# Patient Record
Sex: Female | Born: 1995 | Hispanic: Yes | Smoking: Former smoker
Health system: Southern US, Community
[De-identification: ages and names within clinical notes are randomized; demographics above are authoritative.]

## PROBLEM LIST (undated history)

## (undated) DIAGNOSIS — Z862 Personal history of diseases of the blood and blood-forming organs and certain disorders involving the immune mechanism: Secondary | ICD-10-CM

## (undated) DIAGNOSIS — N644 Mastodynia: Secondary | ICD-10-CM

## (undated) HISTORY — DX: Mastodynia: N64.4

## (undated) HISTORY — PX: OTHER SURGICAL HISTORY: SHX169

## (undated) HISTORY — DX: Personal history of diseases of the blood and blood-forming organs and certain disorders involving the immune mechanism: Z86.2

---
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
## 2018-06-13 DIAGNOSIS — F419 Anxiety disorder, unspecified: Secondary | ICD-10-CM

## 2018-06-13 HISTORY — DX: Anxiety disorder, unspecified: F41.9

## 2018-08-30 ENCOUNTER — Emergency Department: Payer: Self-pay

## 2018-08-30 ENCOUNTER — Emergency Department
Admission: EM | Admit: 2018-08-30 | Discharge: 2018-08-31 | Disposition: A | Discharge status: Home / Self Care | Payer: Self-pay | Attending: Student in an Organized Health Care Education/Training Program | Admitting: Student in an Organized Health Care Education/Training Program

## 2018-08-30 ENCOUNTER — Encounter: Payer: Self-pay | Admitting: Emergency Medicine

## 2018-08-30 ENCOUNTER — Other Ambulatory Visit: Payer: Self-pay

## 2018-08-30 DIAGNOSIS — R1032 Left lower quadrant pain: Secondary | ICD-10-CM | POA: Insufficient documentation

## 2018-08-30 DIAGNOSIS — R112 Nausea with vomiting, unspecified: Secondary | ICD-10-CM

## 2018-08-30 DIAGNOSIS — B9689 Other specified bacterial agents as the cause of diseases classified elsewhere: Secondary | ICD-10-CM | POA: Insufficient documentation

## 2018-08-30 DIAGNOSIS — N760 Acute vaginitis: Secondary | ICD-10-CM | POA: Insufficient documentation

## 2018-08-30 LAB — URINALYSIS, COMPLETE (UACMP) WITH MICROSCOPIC
BACTERIA UA: NONE SEEN
Bilirubin Urine: NEGATIVE
GLUCOSE, UA: NEGATIVE mg/dL
Ketones, ur: NEGATIVE mg/dL
NITRITE: NEGATIVE
Protein, ur: 30 mg/dL — AB
Specific Gravity, Urine: 1.032 — ABNORMAL HIGH (ref 1.005–1.030)
pH: 5 (ref 5.0–8.0)

## 2018-08-30 LAB — WET PREP, GENITAL
SPERM: NONE SEEN
Trich, Wet Prep: NONE SEEN

## 2018-08-30 LAB — COMPREHENSIVE METABOLIC PANEL
ALBUMIN: 4.7 g/dL (ref 3.5–5.0)
ALK PHOS: 37 U/L — AB (ref 38–126)
ALT: 21 U/L (ref 0–44)
ANION GAP: 9 (ref 5–15)
AST: 27 U/L (ref 15–41)
BUN: 14 mg/dL (ref 6–20)
CALCIUM: 9.3 mg/dL (ref 8.9–10.3)
CHLORIDE: 106 mmol/L (ref 98–111)
CO2: 25 mmol/L (ref 22–32)
Creatinine, Ser: 0.74 mg/dL (ref 0.44–1.00)
GFR calc Af Amer: >60 mL/min (ref >60)
GFR calc non Af Amer: >60 mL/min (ref >60)
GLUCOSE: 111 mg/dL — AB (ref 70–99)
Potassium: 3.6 mmol/L (ref 3.5–5.1)
SODIUM: 140 mmol/L (ref 135–145)
Total Bilirubin: 0.5 mg/dL (ref 0.3–1.2)
Total Protein: 7.8 g/dL (ref 6.5–8.1)

## 2018-08-30 LAB — CHLAMYDIA/NGC RT PCR (ARMC ONLY)
Chlamydia Tr: NOT DETECTED
N gonorrhoeae: NOT DETECTED

## 2018-08-30 LAB — INFLUENZA PANEL BY PCR (TYPE A & B)
Influenza A By PCR: NEGATIVE
Influenza B By PCR: NEGATIVE

## 2018-08-30 LAB — CBC
HEMATOCRIT: 38.8 % (ref 36.0–46.0)
HEMOGLOBIN: 12.8 g/dL (ref 12.0–15.0)
MCH: 29.6 pg (ref 26.0–34.0)
MCHC: 33 g/dL (ref 30.0–36.0)
MCV: 89.8 fL (ref 80.0–100.0)
Platelets: 304 10*3/uL (ref 150–400)
RBC: 4.32 MIL/uL (ref 3.87–5.11)
RDW: 13.2 % (ref 11.5–15.5)
WBC: 14.9 10*3/uL — ABNORMAL HIGH (ref 4.0–10.5)
nRBC: 0 % (ref 0.0–0.2)

## 2018-08-30 LAB — POCT PREGNANCY, URINE: Preg Test, Ur: NEGATIVE

## 2018-08-30 LAB — LIPASE, BLOOD: Lipase: 37 U/L (ref 11–51)

## 2018-08-30 MED ORDER — METRONIDAZOLE 500 MG PO TABS: 500.0000 mg | ORAL_TABLET | Freq: Two times a day (BID) | ORAL | 0 refills | Status: DC

## 2018-08-30 MED ORDER — SODIUM CHLORIDE 0.9% FLUSH: 3.0000 mL | Freq: Once | INTRAVENOUS | Status: DC

## 2018-08-30 MED ORDER — FLUCONAZOLE 150 MG PO TABS: 150.0000 mg | ORAL_TABLET | Freq: Every day | ORAL | 0 refills | Status: DC

## 2018-08-30 MED ORDER — METRONIDAZOLE 500 MG PO TABS: 500.0000 mg | ORAL_TABLET | Freq: Two times a day (BID) | ORAL | 0 refills | Status: AC

## 2018-08-30 MED ORDER — ONDANSETRON 4 MG PO TBDP: 4.0000 mg | ORAL_TABLET | Freq: Three times a day (TID) | ORAL | 0 refills | Status: DC | PRN

## 2018-08-30 MED ORDER — PROMETHAZINE HCL 25 MG/ML IJ SOLN
12.5000 mg | Freq: Four times a day (QID) | INTRAMUSCULAR | Status: DC | PRN
  Administered 2018-08-30: 12.5 mg via INTRAVENOUS
  Filled 2018-08-30: qty 1

## 2018-08-30 MED ORDER — SODIUM CHLORIDE 0.9 % IV BOLUS
1000.0000 mL | Freq: Once | INTRAVENOUS | Status: AC
  Administered 2018-08-30: 1000 mL via INTRAVENOUS

## 2018-08-30 NOTE — Discharge Instructions (Addendum)

## 2018-08-30 NOTE — ED Provider Notes (Signed)
[facility] Emergency Department Provider Note    First MD Initiated Contact with Patient 08/30/18 2103     (approximate)  I have reviewed the triage vital signs and the nursing notes.   HISTORY  Chief Complaint Fever    HPI Kellie Lowery is a 23 y.o. female presents for evaluation of nausea vomiting and crampy abdominal pain.  States that she can feel her insides moving.  Denies any diarrhea.  Has had multiple episodes of nonbloody emesis.  Says that she is having fevers at home as well.  No cough or congestion.  No known sick contacts.   She is sexually active but denies any vaginal discharge but was initially interested in being screened for STI.  Denies any right lower quadrant abdominal pain.  States is been primarily on the left side.   History reviewed. No pertinent past medical history. No family history on file. History reviewed. No pertinent surgical history. There are no active problems to display for this patient.     Prior to Admission medications   Medication Sig Start Date End Date Taking? Authorizing Provider  fluconazole (DIFLUCAN) 150 MG tablet Take 1 tablet (150 mg total) by mouth daily. 08/30/18   Willy Eddy, MD  metroNIDAZOLE (FLAGYL) 500 MG tablet Take 1 tablet (500 mg total) by mouth 2 (two) times daily for 7 days. 08/30/18 09/06/18  Willy Eddy, MD  ondansetron (ZOFRAN ODT) 4 MG disintegrating tablet Take 1 tablet (4 mg total) by mouth every 8 (eight) hours as needed for nausea or vomiting. 08/30/18   Willy Eddy, MD    Allergies Patient has no known allergies.    Social History Social History   Tobacco Use  . Smoking status: Never Smoker  . Smokeless tobacco: Never Used  Substance Use Topics  . Alcohol use: Yes  . Drug use: Not on file    Review of Systems Patient denies headaches, rhinorrhea, blurry vision, numbness, shortness of breath, chest pain, edema, cough, abdominal pain, nausea, vomiting,  diarrhea, dysuria, fevers, rashes or hallucinations unless otherwise stated above in HPI. ____________________________________________   PHYSICAL EXAM:  VITAL SIGNS: Vitals:   08/30/18 2200 08/30/18 2230  BP: 131/82 (!) 90/52  Pulse: 78 73  Resp:    Temp:    SpO2: 100% 100%    Constitutional: Alert and oriented.  Eyes: Conjunctivae are normal.  Head: Atraumatic. Nose: No congestion/rhinnorhea. Mouth/Throat: Mucous membranes are moist.   Neck: No stridor. Painless ROM.  Cardiovascular: Normal rate, regular rhythm. Grossly normal heart sounds.  Good peripheral circulation. Respiratory: Normal respiratory effort.  No retractions. Lungs CTAB. Gastrointestinal: Soft and with minimal llq ttp.  No rlq or ruq ttp. No distention. No abdominal bruits. No CVA tenderness. Genitourinary: deferred Musculoskeletal: No lower extremity tenderness nor edema.  No joint effusions. Neurologic:  Normal speech and language. No gross focal neurologic deficits are appreciated. No facial droop Skin:  Skin is warm, dry and intact. No rash noted. Psychiatric: Mood and affect are normal. Speech and behavior are normal.  ____________________________________________   LABS (all labs ordered are listed, but only abnormal results are displayed)  Results for orders placed or performed during the hospital encounter of 08/30/18 (from the past 24 hour(s))  Lipase, blood     Status: None   Collection Time: 08/30/18  6:20 PM  Result Value Ref Range   Lipase 37 11 - 51 U/L  Comprehensive metabolic panel     Status: Abnormal   Collection Time: 08/30/18  6:20  PM  Result Value Ref Range   Sodium 140 135 - 145 mmol/L   Potassium 3.6 3.5 - 5.1 mmol/L   Chloride 106 98 - 111 mmol/L   CO2 25 22 - 32 mmol/L   Glucose, Bld 111 (H) 70 - 99 mg/dL   BUN 14 6 - 20 mg/dL   Creatinine, Ser 1.74 0.44 - 1.00 mg/dL   Calcium 9.3 8.9 - 94.4 mg/dL   Total Protein 7.8 6.5 - 8.1 g/dL   Albumin 4.7 3.5 - 5.0 g/dL   AST 27 15  - 41 U/L   ALT 21 0 - 44 U/L   Alkaline Phosphatase 37 (L) 38 - 126 U/L   Total Bilirubin 0.5 0.3 - 1.2 mg/dL   GFR calc non Af Amer >60 >60 mL/min   GFR calc Af Amer >60 >60 mL/min   Anion gap 9 5 - 15  CBC     Status: Abnormal   Collection Time: 08/30/18  6:20 PM  Result Value Ref Range   WBC 14.9 (H) 4.0 - 10.5 K/uL   RBC 4.32 3.87 - 5.11 MIL/uL   Hemoglobin 12.8 12.0 - 15.0 g/dL   HCT 96.7 59.1 - 63.8 %   MCV 89.8 80.0 - 100.0 fL   MCH 29.6 26.0 - 34.0 pg   MCHC 33.0 30.0 - 36.0 g/dL   RDW 46.6 59.9 - 35.7 %   Platelets 304 150 - 400 K/uL   nRBC 0.0 0.0 - 0.2 %  Pregnancy, urine POC     Status: None   Collection Time: 08/30/18  6:55 PM  Result Value Ref Range   Preg Test, Ur NEGATIVE NEGATIVE  Influenza panel by PCR (type A & B)     Status: None   Collection Time: 08/30/18  9:26 PM  Result Value Ref Range   Influenza A By PCR NEGATIVE NEGATIVE   Influenza B By PCR NEGATIVE NEGATIVE  Wet prep, genital     Status: Abnormal   Collection Time: 08/30/18 10:07 PM  Result Value Ref Range   Yeast Wet Prep HPF POC PRESENT (A) NONE SEEN   Trich, Wet Prep NONE SEEN NONE SEEN   Clue Cells Wet Prep HPF POC PRESENT (A) NONE SEEN   WBC, Wet Prep HPF POC MODERATE (A) NONE SEEN   Sperm NONE SEEN    ____________________________________________ ____________________________________________  RADIOLOGY  I personally reviewed all radiographic images ordered to evaluate for the above acute complaints and reviewed radiology reports and findings.  These findings were personally discussed with the patient.  Please see medical record for radiology report.  ____________________________________________   PROCEDURES  Procedure(s) performed:  Procedures    Critical Care performed: no ____________________________________________   INITIAL IMPRESSION / ASSESSMENT AND PLAN / ED COURSE  Pertinent labs & imaging results that were available during my care of the patient were reviewed by me  and considered in my medical decision making (see chart for details).   DDX: Enteritis, colitis, viral illness, UTI, pregnancy, torsion, TOA, PID, doubt appendicitis  Kellie Lowery is a 23 y.o. who presents to the ED with symptoms as described above.  Patient nontoxic-appearing but does have mild leukocytosis.  We will check urinalysis.  X-ray shows no evidence of obstruction.  Exam is not consistent with PID.  Patient declining pelvic exam which I think is reasonable given lack of significant discharge but will order ultrasound to evaluate for torsion, TOA or other causes of left lower quadrant abdominal pain.  Most likely scenario is  viral enteritis.  This does not seem clinically consistent with acute appendicitis.  Give IV fluids as well as IV antiemetics.  Will reassess.  If ultrasound negative anticipate discharge home.  Clinical Course as of Aug 30 2247  Thu Aug 30, 2018  2245 Urinalysis does not show any evidence of UTI.  Wet prep is positive for yeast as well as clue cells.  Will treat for that.  Pending ultrasound to evaluate for TOA or torsion.  Anticipate that that is negative patient will be stable and appropriate for discharge home.   [PR]    Clinical Course User Index [PR] Willy Eddy, MD     As part of my medical decision making, I reviewed the following data within the electronic MEDICAL RECORD NUMBER Nursing notes reviewed and incorporated, Labs reviewed, notes from prior ED visits and [place] Controlled Substance Database   ____________________________________________   FINAL CLINICAL IMPRESSION(S) / ED DIAGNOSES  Final diagnoses:  Nausea & vomiting  Left lower quadrant abdominal pain  LLQ abdominal pain  Bacterial vaginosis      NEW MEDICATIONS STARTED DURING THIS VISIT:  New Prescriptions   FLUCONAZOLE (DIFLUCAN) 150 MG TABLET    Take 1 tablet (150 mg total) by mouth daily.   METRONIDAZOLE (FLAGYL) 500 MG TABLET    Take 1 tablet (500 mg total) by mouth 2 (two)  times daily for 7 days.   ONDANSETRON (ZOFRAN ODT) 4 MG DISINTEGRATING TABLET    Take 1 tablet (4 mg total) by mouth every 8 (eight) hours as needed for nausea or vomiting.     Note:  This document was prepared using Dragon voice recognition software and may include unintentional dictation errors.    Willy Eddy, MD 08/30/18 2249

## 2018-08-30 NOTE — ED Triage Notes (Signed)
Pt c/o fever x2 days and vomiting x4 today. PT also states body aches.

## 2018-08-30 NOTE — ED Notes (Signed)
Pt states that for the past three days she has felt weak, generalized body aches, nausea, vomiting and fever. She states that her recorded fever at home was 100.2. Family at the bedside.

## 2019-06-28 ENCOUNTER — Telehealth: Payer: Self-pay | Admitting: Family Medicine

## 2019-06-28 NOTE — Telephone Encounter (Signed)
Call to client and removal scheduled for 07/12/2019. Client to arrive at 0800 and states this is an okay time. Jossie Ng, RN

## 2019-06-28 NOTE — Telephone Encounter (Signed)
wants nexplanon removed

## 2019-07-12 ENCOUNTER — Ambulatory Visit (LOCAL_COMMUNITY_HEALTH_CENTER): Payer: Self-pay | Admitting: Advanced Practice Midwife

## 2019-07-12 ENCOUNTER — Encounter: Payer: Self-pay | Admitting: Advanced Practice Midwife

## 2019-07-12 ENCOUNTER — Other Ambulatory Visit: Payer: Self-pay

## 2019-07-12 VITALS — BP 95/56 | Ht 62.75 in | Wt 141.2 lb

## 2019-07-12 DIAGNOSIS — Z3009 Encounter for other general counseling and advice on contraception: Secondary | ICD-10-CM

## 2019-07-12 DIAGNOSIS — Z3040 Encounter for surveillance of contraceptives, unspecified: Secondary | ICD-10-CM

## 2019-07-12 DIAGNOSIS — Z8659 Personal history of other mental and behavioral disorders: Secondary | ICD-10-CM

## 2019-07-12 DIAGNOSIS — Z9152 Personal history of nonsuicidal self-harm: Secondary | ICD-10-CM | POA: Insufficient documentation

## 2019-07-12 NOTE — Progress Notes (Signed)
In desiring Nexplanon removal due to irregular bleeding x "months" and losing hair Sharlette Dense, RN

## 2019-07-12 NOTE — Progress Notes (Signed)
   [facility] problem [facility]  Subjective:  Berna Bue is a 24 y.o.SHF nullip nonsmoker being seen today for Nexplanon removal (inserted 2018) No chief complaint on file.   HPI Pt wants Nexplanon removal due to losing hair and irregular bleeding x "months".  LMP??  Last sex 07/07/19 without condom.  Not working, not in school.  Living with cousins.  Doesn't want any birth control and doesn't want pregnancy; counseled pt will become pregnant if having unprotected sex--still declines  Does the patient have a current or past history of drug use? No   No components found for: HCV]   Health Maintenance Due  Topic Date Due  . HIV Screening  01/26/2013  . INFLUENZA VACCINE  01/12/2019  . PAP-Cervical Cytology Screening  01/27/2019  . PAP SMEAR-Modifier  01/27/2019    ROS  The following portions of the patient's history were reviewed and updated as appropriate: allergies, current medications, past family history, past medical history, past social history, past surgical history and problem list. Problem list updated.   See flowsheet for other program required questions.  Objective:   Vitals:   07/12/19 0832  BP: (!) 95/56  Weight: 141 lb 3.2 oz (64 kg)  Height: 5' 2.75" (1.594 m)    Physical Exam    Assessment and Plan:  Berna Bue is a 24 y.o. female presenting to the [facility] Department for a Women's Health problem visit  1. Hx of self mutilation--cutter age 59  2. Family planning Nexplanon Removal Patient identified, informed consent performed, consent signed.   Appropriate time out taken. Nexplanon site identified.  Area prepped in usual sterile fashon. 3 ml of 1% lidocaine with Epinephrine was used to anesthetize the area at the distal end of the implant and along implant site. A small stab incision was made right beside the implant on the distal portion.  The Nexplanon rod was grasped using straight  hemostats/manual and removed without difficulty.  There was minimal blood loss. There were no complications.  Steri-strips were applied over the small incision.  A pressure bandage was applied to reduce any bruising.  The patient tolerated the procedure well and was given post procedure instructions.  Nexplanon:   Counseled patient to take OTC analgesic starting as soon as lidocaine starts to wear off and take regularly for at least 48 hr to decrease discomfort.  Specifically to take with food or milk to decrease stomach upset and for IB 600 mg (3 tablets) every 6 hrs; IB 800 mg (4 tablets) every 8 hrs; or Aleve 2 tablets every 12 hrs.     3. Encounter for surveillance of contraceptives, unspecified contraceptive Pt refuses birth control     No follow-ups on file.  No future appointments.  Alberteen Spindle, CNM

## 2020-06-26 ENCOUNTER — Ambulatory Visit (LOCAL_COMMUNITY_HEALTH_CENTER): Payer: 59 | Admitting: Physician Assistant

## 2020-06-26 ENCOUNTER — Other Ambulatory Visit: Payer: Self-pay

## 2020-06-26 VITALS — BP 114/74 | Ht 63.0 in | Wt 149.0 lb

## 2020-06-26 DIAGNOSIS — Z7189 Other specified counseling: Secondary | ICD-10-CM

## 2020-06-26 NOTE — Progress Notes (Signed)
Patient states she is present today with a headache she has had for three days now and had a fever two days ago I requested she needs to go get a covid test and reschedule after she received a negative test patient was givin information on [facility] covid testing and patient left to go get tested. Patient stated symptoms after vitals and some paaperwork was signed.

## 2020-06-26 NOTE — Progress Notes (Signed)
Patient into clinic scheduled for IP/RP in [facility].  Patient reports to CNA, while she is getting the patient's vitals, that she has had a headache for 2 days and a fever 2 days ago.  Counseled CNA to give patient information on [facility] for COVID testing and that she can reschedule her appointment for another time, after she has been tested and without symptoms.

## 2020-08-12 ENCOUNTER — Other Ambulatory Visit: Payer: Self-pay

## 2020-08-12 ENCOUNTER — Encounter: Payer: Self-pay | Admitting: Advanced Practice Midwife

## 2020-08-12 ENCOUNTER — Ambulatory Visit: Payer: Self-pay

## 2020-08-12 ENCOUNTER — Ambulatory Visit (LOCAL_COMMUNITY_HEALTH_CENTER): Payer: 59 | Admitting: Advanced Practice Midwife

## 2020-08-12 VITALS — BP 106/59 | HR 71 | Temp 98.3°F | Ht 63.0 in | Wt 154.8 lb

## 2020-08-12 DIAGNOSIS — Z3009 Encounter for other general counseling and advice on contraception: Secondary | ICD-10-CM | POA: Diagnosis not present

## 2020-08-12 DIAGNOSIS — F419 Anxiety disorder, unspecified: Secondary | ICD-10-CM | POA: Insufficient documentation

## 2020-08-12 DIAGNOSIS — E663 Overweight: Secondary | ICD-10-CM

## 2020-08-12 LAB — WET PREP FOR TRICH, YEAST, CLUE
Trichomonas Exam: NEGATIVE
Yeast Exam: NEGATIVE

## 2020-08-12 LAB — HEMOGLOBIN, FINGERSTICK: Hemoglobin: 12 g/dL (ref 11.1–15.9)

## 2020-08-12 NOTE — Progress Notes (Signed)
Wet mount and hgb reviewed - no interventions needed per standing order. Jossie Ng, RN

## 2020-08-12 NOTE — Progress Notes (Signed)
[facility] [address] Main Number: [phone]    Family Planning Visit- Initial Visit  Subjective:  Kellie Lowery is a 25 y.o. SHF G1P0010 exsmoker  being seen today for an initial well woman visit and to discuss family planning options.  She is currently using None for pregnancy prevention. Patient reports she does want a pregnancy in the next year.  Patient has the following medical conditions has Hx of self mutilation--cutter age 26 and Overweight BMI=27.4 on their problem list.  Chief Complaint  Patient presents with  . Annual Exam    Desires pregnancy    Patient reports wants to conceive.  Needs pap and physical.  Nexplanon removed 07/12/19.  LMP 07/16/20.  Last sex 08/08/20 without condom; with current partner x 2 years; 1 partner in last 3 mo.  Last cig 2021. Last vaped 2021.  Last MJ 08/08/20.  Last ETOH 06/06/20 (6 Smirnoffs+8 shots Petrone+2 shots liquor) 4x/year.  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 C/o "hard to get out of bed the past 2 mo, gained 20 lbs in 2 mo". Agrees to counseling with [name], LCSW.  No pap on record.  Patient denies SI/HI   Body mass index is 27.42 kg/m. - Patient is eligible for diabetes screening based on BMI and age >42?  not applicable HA1C ordered? not applicable  Patient reports 1  partner/s in last year. Desires STI screening?  Yes  Has patient been screened once for HCV in the past?  No  No results found for: HCVAB  Does the patient have current drug use (including MJ), have a partner with drug use, and/or has been incarcerated since last result? Yes  If yes-- Screen for HCV through [facility]   Does the patient meet criteria for HBV testing? No  Criteria:  -Household, sexual or needle sharing contact with HBV -History of drug use -HIV positive -Those with known Hep C   Health Maintenance Due  Topic Date Due  . Hepatitis C Screening  Never done  .  COVID-19 Vaccine (1) Never done  . HPV VACCINES (1 - 2-dose series) Never done  . HIV Screening  Never done  . PAP-Cervical Cytology Screening  Never done  . PAP SMEAR-Modifier  Never done  . INFLUENZA VACCINE  Never done    Review of Systems  Constitutional: Positive for weight loss (20 lbs wt gain in past 2 mo.; plays soccer).    The following portions of the patient's history were reviewed and updated as appropriate: allergies, current medications, past family history, past medical history, past social history, past surgical history and problem list. Problem list updated.   See flowsheet for other program required questions.  Objective:   Vitals:   08/12/20 1509  BP: (!) 106/59  Pulse: 71  Temp: 98.3 F (36.8 C)  Weight: 154 lb 12.8 oz (70.2 kg)  Height: 5\' 3"  (1.6 m)    Physical Exam Constitutional:      Appearance: Normal appearance. She is normal weight.  HENT:     Head: Normocephalic and atraumatic.     Mouth/Throat:     Mouth: Mucous membranes are moist.  Eyes:     Conjunctiva/sclera: Conjunctivae normal.  Neck:     Comments: Thyroid without masses or enlargement Negative cervical lymphaddenopathy Cardiovascular:     Rate and Rhythm: Normal rate and regular rhythm.  Pulmonary:     Effort: Pulmonary effort is normal.  Breath sounds: Normal breath sounds.  Chest:  Breasts:     Right: Normal.     Left: Normal.      Comments: C/o left breast outer quadrant tenderness x 1 year. No masses palpated. To stop caffeine x 2 mo, take Vitamin E daily and return if not better in 2 mo Abdominal:     Palpations: Abdomen is soft.     Comments: Soft without masses or tenderness  Genitourinary:    General: Normal vulva.     Exam position: Lithotomy position.     Vagina: Vaginal discharge (grey leukorrhea, ph>4.5 with sl malodor; c/o pain after sex x hours x1) present.     Cervix: Normal.     Uterus: Normal.      Adnexa: Right adnexa normal and left adnexa normal.      Rectum: Normal.     Comments: Pap done; neg CMT Musculoskeletal:        General: Normal range of motion.     Cervical back: Normal range of motion and neck supple.  Skin:    General: Skin is warm and dry.  Neurological:     Mental Status: She is alert.  Psychiatric:        Mood and Affect: Mood normal.       Assessment and Plan:  Kellie Lowery is a 25 y.o. female presenting to the [facility] Department for an initial well woman exam/family planning visit  Contraception counseling: Reviewed all forms of birth control options in the tiered based approach. available including abstinence; over the counter/barrier methods; hormonal contraceptive medication including pill, patch, ring, injection,contraceptive implant, ECP; hormonal and nonhormonal IUDs; permanent sterilization options including vasectomy and the various tubal sterilization modalities. Risks, benefits, and typical effectiveness rates were reviewed.  Questions were answered.  Written information was also given to the patient to review.  Patient desires to conceive, this was prescribed for patient. She will follow up in prn for surveillance.  She was told to call with any further questions, or with any concerns about this method of contraception.  Emphasized use of condoms 100% of the time for STI prevention.  Patient was not offered ECP. Marland Kitchen ECP was not accepted by the patient. ECP counseling was not given - see RN documentation  1. Overweight BMI=27.4   2. Family planning Pt desires Tb skin test today Treat wet mount per standing orders Immunization nurse consult - WET PREP FOR TRICH, YEAST, CLUE - Hemoglobin, venipuncture - Chlamydia/Gonorrhea [place] [facility] - Syphilis Serology, [facility] - HIV [place] [facility] - Pap IG (Image Guided) - Ambulatory referral to Behavioral Health     Return in about 1 year (around 08/12/2021) for yearly physical exam.  No future appointments.  Alberteen Spindle, CNM

## 2020-08-17 LAB — PAP IG (IMAGE GUIDED): PAP Smear Comment: 0

## 2020-10-12 ENCOUNTER — Encounter: Payer: Self-pay | Admitting: Nurse Practitioner

## 2020-10-12 NOTE — Progress Notes (Signed)
PAP normal, HPV not done.  Repeat PAP in 3 years (08/2023) per Arnetha Courser, CNM. Routed 10/09/2020.  PAP card mailed today.  MyCHART account not active. [name], RN

## 2021-04-19 ENCOUNTER — Ambulatory Visit: Payer: 59

## 2021-05-05 ENCOUNTER — Other Ambulatory Visit: Payer: Self-pay

## 2021-05-05 ENCOUNTER — Ambulatory Visit: Payer: Self-pay | Admitting: Physician Assistant

## 2021-05-05 DIAGNOSIS — Z113 Encounter for screening for infections with a predominantly sexual mode of transmission: Secondary | ICD-10-CM

## 2021-05-05 NOTE — Progress Notes (Signed)
Patient into clinic for IS visit.  Patient reports symptoms but also reports that she started an OTC antifungal cream last pm to help relieve her symptoms.  Counseled patient that due to using cream, the testing if it was done today would not be accurate.  Recommended that patient complete full course of treatment that she has started and RTC if her symptoms have not resolved.  Counseled that for ost accurate results, she should not use anything in her vagina for 48-72 hr prior to pelvic exam.  Patient agrees with above plan.

## 2021-06-13 NOTE — L&D Delivery Note (Addendum)
         Delivery Note   Kellie Lowery is a 26 y.o. [name] at [redacted]w[redacted]d Estimated Date of Delivery: 05/01/22  PRE-OPERATIVE DIAGNOSIS:  1) [redacted]w[redacted]d pregnancy.    POST-OPERATIVE DIAGNOSIS:  1) [redacted]w[redacted]d pregnancy s/p Vaginal, Spontaneous    Delivery Type: Vaginal, Spontaneous    Delivery Anesthesia: Epidural   Labor Complications:  none    ESTIMATED BLOOD LOSS: 200  ml    FINDINGS:   1) female infant, Apgar scores of    8 at 1 minute and   9 at 5 minutes and a birthweight pending, infant remains skin to skin.    2) Nuchal cord: no  SPECIMENS:   PLACENTA:   Appearance: Intact , 3 vessel cord   Removal: Spontaneous      Disposition:  per protocol   DISPOSITION:  Infant to left in stable condition in the delivery room, with L&D personnel and mother,  NARRATIVE SUMMARY: Labor course:  Kellie Lowery is a [name] at [redacted]w[redacted]d who presented for labor management.  She progressed well in labor with pitocin.  She received the appropriate epidural anesthesia and proceeded to complete dilation. She evidenced good maternal expulsive effort during the second stage. She went on to deliver a viable female infant "Bella". The placenta delivered without problems and was noted to be complete. A perineal and vaginal examination was performed. Episiotomy/Lacerations: 1st degree;Vaginal  Lacerations was repaired with 3-0 Vicryl rapide suture. The patient tolerated this well.  Doreene Burke, CNM  04/27/2022 5:35 PM

## 2021-08-30 ENCOUNTER — Ambulatory Visit (LOCAL_COMMUNITY_HEALTH_CENTER): Payer: Self-pay

## 2021-08-30 ENCOUNTER — Other Ambulatory Visit: Payer: Self-pay

## 2021-08-30 VITALS — BP 116/75 | Ht 64.0 in | Wt 159.0 lb

## 2021-08-30 DIAGNOSIS — Z3201 Encounter for pregnancy test, result positive: Secondary | ICD-10-CM

## 2021-08-30 LAB — PREGNANCY, URINE: Preg Test, Ur: POSITIVE — AB

## 2021-08-30 MED ORDER — PRENATAL VITAMINS 28-0.8 MG PO TABS: 1.0000 | ORAL_TABLET | Freq: Every day | ORAL | 0 refills | Status: DC

## 2021-08-30 NOTE — Progress Notes (Signed)
UPT positive.  Plans prenatal care at [facility]; referred to clerk for prenatal appt. C/o vaginal discharge with itching. Counseled ways to manage vaginal d/c and itching. Gave pregnancy informational packet and pointed out safe meds during pregnancy. Advised stay well hydrated and when to seek medical attention if any problems.  Referred to DSS for medicaid application.  Cherlynn Polo, RN  ?

## 2021-09-27 ENCOUNTER — Ambulatory Visit: Payer: Medicaid Other | Admitting: Nurse Practitioner

## 2021-09-27 ENCOUNTER — Encounter: Payer: Self-pay | Admitting: Advanced Practice Midwife

## 2021-09-27 ENCOUNTER — Other Ambulatory Visit: Payer: Self-pay

## 2021-09-27 VITALS — BP 111/70 | HR 101 | Temp 97.8°F | Wt 157.4 lb

## 2021-09-27 DIAGNOSIS — Z3481 Encounter for supervision of other normal pregnancy, first trimester: Secondary | ICD-10-CM

## 2021-09-27 DIAGNOSIS — O99341 Other mental disorders complicating pregnancy, first trimester: Secondary | ICD-10-CM

## 2021-09-27 DIAGNOSIS — F1290 Cannabis use, unspecified, uncomplicated: Secondary | ICD-10-CM

## 2021-09-27 DIAGNOSIS — F419 Anxiety disorder, unspecified: Secondary | ICD-10-CM

## 2021-09-27 DIAGNOSIS — Z3480 Encounter for supervision of other normal pregnancy, unspecified trimester: Secondary | ICD-10-CM

## 2021-09-27 DIAGNOSIS — O99340 Other mental disorders complicating pregnancy, unspecified trimester: Secondary | ICD-10-CM

## 2021-09-27 HISTORY — DX: Encounter for supervision of other normal pregnancy, unspecified trimester: Z34.80

## 2021-09-27 LAB — URINALYSIS
Bilirubin, UA: NEGATIVE
Glucose, UA: NEGATIVE
Ketones, UA: NEGATIVE
Nitrite, UA: NEGATIVE
Protein,UA: NEGATIVE
RBC, UA: NEGATIVE
Specific Gravity, UA: 1.025 (ref 1.005–1.030)
Urobilinogen, Ur: 0.2 mg/dL (ref 0.2–1.0)
pH, UA: 6 (ref 5.0–7.5)

## 2021-09-27 LAB — HEMOGLOBIN, FINGERSTICK: Hemoglobin: 11.8 g/dL (ref 11.1–15.9)

## 2021-09-27 LAB — WET PREP FOR TRICH, YEAST, CLUE
Trichomonas Exam: NEGATIVE
Yeast Exam: NEGATIVE

## 2021-09-27 NOTE — Progress Notes (Signed)
Presents for initiation of prenatal care. Per client, arrived to [place] from [place] in 2014 and no international travel since. FOB was born in [place] and lived in [place] until age 26 years old when returned to the [place]. He returned from 2 week trip to [place] 09/11/2021. Client denies any medical care while pregnant prior to appt today. Rich Number, RN ?Wet prep negative,hgb = 11.8, urine dip normal with trace leukocytes and no interventions required per standing order. Client counseled will receive call when [place] scheduled. Rich Number, RN ? ?

## 2021-09-27 NOTE — Progress Notes (Signed)
[facility]  ?Maternal Health Clinic ? ? ?INITIAL PRENATAL VISIT NOTE ? ?Subjective:  ?Kellie Lowery is a 26 y.o. [name] at 7243w3d being seen today to start prenatal care at the [facility].  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 ? ?Patient reports  nausea, vomiting, tiredness, slight cramps at times.  .  Contractions: Not present. Vag. Bleeding: None.  Movement: Present. Denies leaking of fluid.  ? ?Indications for ASA therapy (per uptodate) ?One of the following: ?Previous pregnancy with preeclampsia, especially early onset and with an adverse outcome No ?Multifetal gestation No ?Chronic hypertension No ?Type 1 or 2 diabetes mellitus No ?Chronic kidney disease No ?Autoimmune disease (antiphospholipid syndrome, systemic lupus erythematosus) No ? ?Two or more of the following: ?Nulliparity Yes ?Obesity (body mass index >30 kg/m2) No ?Family history of preeclampsia in mother or sister No ?Age ?35 years No ?Sociodemographic characteristics (African American race, low socioeconomic level) Yes ?Personal risk factors (eg, previous pregnancy with low birth weight or small for gestational age infant, previous adverse pregnancy outcome [eg, stillbirth], interval >10 years between pregnancies) No ? ? ?The following portions of the patient's history were reviewed and updated as appropriate: allergies, current medications, past family history, past medical history, past social history, past surgical history and problem list. Problem list updated. ? ?Objective:  ? ?Vitals:  ? 09/27/21 1322  ?BP: 111/70  ?Pulse: (!) 101  ?Temp: 97.8 ?F (36.6 ?C)  ?Weight: 157 lb 6.4 oz (71.4 kg)  ? ? ?Fetal Status: Fetal Heart Rate (bpm): not heard  Fundal Height: 11 cm Movement: Present   Presentation: Undeterminable ? ? ?Physical Exam ?Vitals and nursing note reviewed.  ?Constitutional:   ?   General: She is not in acute distress. ?   Appearance: Normal appearance. She is well-developed.  ?HENT:  ?   Head: Normocephalic and atraumatic.  ?   Right Ear: External ear normal.  ?   Left Ear: External ear normal.  ?   Nose: Nose normal. No congestion or rhinorrhea.  ?   Mouth/Throat:  ?   Lips: Pink.  ?   Mouth: Mucous membranes are moist.  ?   Dentition: Normal dentition. No dental caries.  ?   Pharynx: Oropharynx is clear. Uvula midline.  ?   Comments: Dentition: no visible signs of dental caries  ?Eyes:  ?   General: No scleral icterus. ?   Conjunctiva/sclera: Conjunctivae normal.  ?   Pupils: Pupils are equal, round, and reactive to light.  ?Neck:  ?   Thyroid: No thyroid mass or thyromegaly.  ?Cardiovascular:  ?   Rate and Rhythm: Normal rate and regular rhythm.  ?   Pulses: Normal pulses.  ?   Comments: Extremities are warm and well perfused ?Pulmonary:  ?   Effort: Pulmonary effort is normal.  ?   Breath sounds: Normal breath sounds.  ?Chest:  ?   Chest wall: No mass.  ?Breasts: ?   Tanner Score is 5.  ?   Breasts are symmetrical.  ?   Right: Normal. No mass, nipple discharge or skin change.  ?   Left: Normal. No mass, nipple discharge or skin change.  ?   Comments: Breasts:  ?      Right: Normal. No swelling, mass, nipple discharge, skin change or tenderness.  ?  Left: Normal. No swelling, mass, nipple discharge, skin change or tenderness.   ?Abdominal:  ?   General: Abdomen is flat.  ?   Palpations: Abdomen is soft.  ?   Tenderness: There is no abdominal tenderness.  ?   Comments: Gravid   ?Genitourinary: ?   General: Normal vulva.  ?   Exam position: Lithotomy position.  ?   Pubic Area: No rash.   ?   Labia:     ?   Right: No rash.     ?   Left: No rash.   ?   Vagina: Normal. No vaginal discharge.  ?   Cervix: No cervical motion tenderness or friability.  ?   Uterus: Normal. Enlarged (Gravid  11 size). Not tender.   ?   Adnexa: Right adnexa normal and left adnexa normal.  ?   Rectum: Normal. No external hemorrhoid.  ?   Comments: External genitalia/pubic area without nits, lice, edema, erythema, lesions and inguinal adenopathy. ?Vagina with normal mucosa and discharge. ?Cervix without visible lesions. ?Uterus firm, mobile, nt, no masses, no CMT, no adnexal tenderness or fullness. pH < 4.5. ?Musculoskeletal:  ?   Cervical back: Full passive range of motion without pain, normal range of motion and neck supple.  ?   Right lower leg: No edema.  ?   Left lower leg: No edema.  ?Lymphadenopathy:  ?   Cervical: No cervical adenopathy.  ?   Upper Body:  ?   Right upper body: No axillary adenopathy.  ?   Left upper body: No axillary adenopathy.  ?Skin: ?   General: Skin is warm and dry.  ?   Capillary Refill: Capillary refill takes less than 2 seconds.  ?Neurological:  ?   Mental Status: She is alert and oriented to person, place, and time.  ?Psychiatric:     ?   Attention and Perception: Attention normal.     ?   Mood and Affect: Mood normal.     ?   Speech: Speech normal.     ?   Behavior: Behavior is cooperative.  ? ? ?Assessment and Plan:  ?Pregnancy: [name] at [redacted]w[redacted]d ? ?1. Supervision of other normal pregnancy, antepartum ?-26 year old female in clinic for initial prenatal visit.  ?-Patient reports nausea, vomiting, slight cramping on and off, and tiredness.  Patient given resources on management of nausea and vomiting.  Patient also given resources sheet regarding recommended weight gain and ASA for prevention of preeclampsia.   ? -Patient states she is feeling good about the pregnancy ?-Lives with father of the baby. ?-Patient is not currently working. ?-Patient reports nausea, vomiting, slight cramping without bleeding.  No other complaints or problems noted.  Denies ED visits.  ?-Last LMP 07/09/21, will send patient for initial U/S to confirm dates.   ?-Last PAP 08/12/20,  NIL.  Next due in 3 years.  ?-Last  use of MJ 08/2021. Denies use since confirmation of pregnancy.  ?-Last dental visit, several years ago.  Reviewed with patient that dental exams are safe and recommended during pregnancy.   ?-More than 2 risk factors indicated for preeclampsia.  Recommended daily ASA 81 mg.   ?-Declines genetic counseling. ?-Declines COVID vaccine history.  Offerred Flu vaccine, declines today.  ? ?- Urine Culture ?- Chlamydia/GC NAA, Confirmation ?- Lead, blood (adult age 11 yrs or greater) ?- Hgb Fractionation Cascade ?- QuantiFERON-TB Gold Plus ?- HIV-1/HIV-2 Qualitative RNA ?- HCV Ab w Reflex to Quant PCR ?- Prenatal profile  without Varicella/Rubella ([id number]) ?- Hemoglobin, venipuncture ?- WET PREP FOR TRICH, YEAST, CLUE ?- Urinalysis (Urine Dip) ? ?2. Marijuana use ?Patient states last MJ use was 08/2021.  UDS submitted today. ? ? ?3. Anxiety disorder affecting pregnancy, antepartum ?-Patient reports a history of anxiety.  PHQ -9 =6.  Denies thoughts of self harm.  Informed patient about counseling services provided through [facility].  Patient declined at this time.   ? ? ? ?Discussed overview of care and coordination with inpatient delivery practices including [facility], [facility], [facility] and [facility].  ? ? ? ? ?Term labor symptoms and general obstetric precautions including but not limited to vaginal bleeding, contractions, leaking of fluid and fetal movement were reviewed in detail with the patient. ? ?Please refer to After Visit Summary for other counseling recommendations.  ? ?  ? ? ? ? ?Return in about 4 weeks (around 10/25/2021) for Routine prenatal care visit. ? ?Future Appointments  ?Date Time Provider Department Center  ?10/25/2021  3:30 PM [facility] PROVIDER [facility] None  ? ? ?[name], FNP  ?

## 2021-09-28 LAB — URINE CULTURE

## 2021-09-28 LAB — CBC/D/PLT+RPR+RH+ABO+AB SCR
Antibody Screen: NEGATIVE
Basophils Absolute: 0 10*3/uL (ref 0.0–0.2)
Basos: 0 %
EOS (ABSOLUTE): 0.1 10*3/uL (ref 0.0–0.4)
Eos: 0 %
Hematocrit: 34.8 % (ref 34.0–46.6)
Hemoglobin: 11.6 g/dL (ref 11.1–15.9)
Hepatitis B Surface Ag: NEGATIVE
Immature Grans (Abs): 0 10*3/uL (ref 0.0–0.1)
Immature Granulocytes: 0 %
Lymphocytes Absolute: 3.2 10*3/uL — ABNORMAL HIGH (ref 0.7–3.1)
Lymphs: 21 %
MCH: 29.2 pg (ref 26.6–33.0)
MCHC: 33.3 g/dL (ref 31.5–35.7)
MCV: 88 fL (ref 79–97)
Monocytes Absolute: 1.1 10*3/uL — ABNORMAL HIGH (ref 0.1–0.9)
Monocytes: 7 %
Neutrophils Absolute: 10.7 10*3/uL — ABNORMAL HIGH (ref 1.4–7.0)
Neutrophils: 72 %
Platelets: 314 10*3/uL (ref 150–450)
RBC: 3.97 x10E6/uL (ref 3.77–5.28)
RDW: 12.7 % (ref 11.7–15.4)
RPR Ser Ql: NONREACTIVE
Rh Factor: POSITIVE
WBC: 15 10*3/uL — ABNORMAL HIGH (ref 3.4–10.8)

## 2021-09-28 LAB — HCV AB W REFLEX TO QUANT PCR: HCV Ab: NONREACTIVE

## 2021-09-28 LAB — HCV INTERPRETATION

## 2021-09-29 ENCOUNTER — Other Ambulatory Visit: Payer: Self-pay | Admitting: Nurse Practitioner

## 2021-09-29 ENCOUNTER — Telehealth: Payer: Self-pay

## 2021-09-29 DIAGNOSIS — Z3480 Encounter for supervision of other normal pregnancy, unspecified trimester: Secondary | ICD-10-CM

## 2021-09-29 LAB — CHLAMYDIA/GC NAA, CONFIRMATION
Chlamydia trachomatis, NAA: NEGATIVE
Neisseria gonorrhoeae, NAA: NEGATIVE

## 2021-09-29 LAB — LEAD, BLOOD (ADULT >= 16 YRS): Lead-Whole Blood: <1 ug/dL (ref 0.0–3.4)

## 2021-09-29 NOTE — Telephone Encounter (Signed)
Call to client with [place] appt which is scheduled for 10/05/2021 at 1300 with arrival time of 1245. Client counseled on facility location and the need to drink 4 glasses of water without using the bathroom by 1300. Client verbalized understanding of instructions. Jossie Ng, RN ? ?

## 2021-09-30 LAB — HGB FRACTIONATION CASCADE
Hgb A2: 3 % (ref 1.8–3.2)
Hgb A: 97 % (ref 96.4–98.8)
Hgb F: 0 % (ref 0.0–2.0)
Hgb S: 0 %

## 2021-09-30 LAB — HIV-1/HIV-2 QUALITATIVE RNA
HIV-1 RNA, Qualitative: NONREACTIVE
HIV-2 RNA, Qualitative: NONREACTIVE

## 2021-09-30 LAB — QUANTIFERON-TB GOLD PLUS
QuantiFERON Mitogen Value: >10 IU/mL
QuantiFERON Nil Value: 0.11 IU/mL
QuantiFERON TB1 Ag Value: 0.17 IU/mL
QuantiFERON TB2 Ag Value: 0.14 IU/mL
QuantiFERON-TB Gold Plus: NEGATIVE

## 2021-10-04 ENCOUNTER — Other Ambulatory Visit: Payer: Self-pay | Admitting: Nurse Practitioner

## 2021-10-04 DIAGNOSIS — Z3480 Encounter for supervision of other normal pregnancy, unspecified trimester: Secondary | ICD-10-CM

## 2021-10-04 DIAGNOSIS — O3680X1 Pregnancy with inconclusive fetal viability, fetus 1: Secondary | ICD-10-CM

## 2021-10-05 ENCOUNTER — Ambulatory Visit
Admission: RE | Admit: 2021-10-05 | Discharge: 2021-10-05 | Disposition: A | Discharge status: Home / Self Care | Payer: Self-pay | Source: Ambulatory Visit | Attending: Nurse Practitioner | Admitting: Nurse Practitioner

## 2021-10-05 ENCOUNTER — Other Ambulatory Visit: Payer: Self-pay | Admitting: Nurse Practitioner

## 2021-10-05 DIAGNOSIS — Z3480 Encounter for supervision of other normal pregnancy, unspecified trimester: Secondary | ICD-10-CM

## 2021-10-05 DIAGNOSIS — O3680X1 Pregnancy with inconclusive fetal viability, fetus 1: Secondary | ICD-10-CM

## 2021-10-05 DIAGNOSIS — Z3689 Encounter for other specified antenatal screening: Secondary | ICD-10-CM | POA: Insufficient documentation

## 2021-10-05 DIAGNOSIS — Z3A10 10 weeks gestation of pregnancy: Secondary | ICD-10-CM | POA: Insufficient documentation

## 2021-10-07 ENCOUNTER — Other Ambulatory Visit: Payer: Self-pay

## 2021-10-07 ENCOUNTER — Emergency Department
Admission: EM | Admit: 2021-10-07 | Discharge: 2021-10-07 | Disposition: A | Discharge status: Home / Self Care | Payer: Self-pay | Attending: Emergency Medicine | Admitting: Emergency Medicine

## 2021-10-07 ENCOUNTER — Emergency Department: Payer: Self-pay

## 2021-10-07 DIAGNOSIS — Z3A12 12 weeks gestation of pregnancy: Secondary | ICD-10-CM | POA: Insufficient documentation

## 2021-10-07 DIAGNOSIS — N9489 Other specified conditions associated with female genital organs and menstrual cycle: Secondary | ICD-10-CM | POA: Insufficient documentation

## 2021-10-07 DIAGNOSIS — O4690 Antepartum hemorrhage, unspecified, unspecified trimester: Secondary | ICD-10-CM

## 2021-10-07 DIAGNOSIS — O26891 Other specified pregnancy related conditions, first trimester: Secondary | ICD-10-CM | POA: Insufficient documentation

## 2021-10-07 DIAGNOSIS — O209 Hemorrhage in early pregnancy, unspecified: Secondary | ICD-10-CM | POA: Insufficient documentation

## 2021-10-07 DIAGNOSIS — N938 Other specified abnormal uterine and vaginal bleeding: Secondary | ICD-10-CM | POA: Insufficient documentation

## 2021-10-07 LAB — URINALYSIS, ROUTINE W REFLEX MICROSCOPIC
Bacteria, UA: NONE SEEN
Bilirubin Urine: NEGATIVE
Glucose, UA: NEGATIVE mg/dL
Ketones, ur: NEGATIVE mg/dL
Nitrite: NEGATIVE
Protein, ur: NEGATIVE mg/dL
Specific Gravity, Urine: 1.015 (ref 1.005–1.030)
pH: 5 (ref 5.0–8.0)

## 2021-10-07 LAB — ABO/RH: ABO/RH(D): A POS

## 2021-10-07 LAB — BASIC METABOLIC PANEL
Anion gap: 9 (ref 5–15)
BUN: 8 mg/dL (ref 6–20)
CO2: 23 mmol/L (ref 22–32)
Calcium: 9.3 mg/dL (ref 8.9–10.3)
Chloride: 103 mmol/L (ref 98–111)
Creatinine, Ser: 0.6 mg/dL (ref 0.44–1.00)
GFR, Estimated: >60 mL/min (ref >60)
Glucose, Bld: 107 mg/dL — ABNORMAL HIGH (ref 70–99)
Potassium: 3.7 mmol/L (ref 3.5–5.1)
Sodium: 135 mmol/L (ref 135–145)

## 2021-10-07 LAB — CBC
HCT: 33.1 % — ABNORMAL LOW (ref 36.0–46.0)
Hemoglobin: 11.4 g/dL — ABNORMAL LOW (ref 12.0–15.0)
MCH: 29.9 pg (ref 26.0–34.0)
MCHC: 34.4 g/dL (ref 30.0–36.0)
MCV: 86.9 fL (ref 80.0–100.0)
Platelets: 289 10*3/uL (ref 150–400)
RBC: 3.81 MIL/uL — ABNORMAL LOW (ref 3.87–5.11)
RDW: 12.7 % (ref 11.5–15.5)
WBC: 14.9 10*3/uL — ABNORMAL HIGH (ref 4.0–10.5)
nRBC: 0 % (ref 0.0–0.2)

## 2021-10-07 LAB — HCG, QUANTITATIVE, PREGNANCY: hCG, Beta Chain, Quant, S: 112811 m[IU]/mL — ABNORMAL HIGH (ref <5)

## 2021-10-07 NOTE — Discharge Instructions (Signed)
Your exam, labs, and ultrasound are normal and reassuring at this time.  No signs of any serious underlying cause for the bleeding.  You should follow-up with your OB provider as scheduled.  Return to the ED if needed. ?

## 2021-10-07 NOTE — ED Provider Notes (Addendum)
? ? ?[facility] ?Emergency Department Provider Note ? ? ? ? Event Date/Time  ? First MD Initiated Contact with Patient 10/07/21 2035   ?  (approximate) ? ? ?History  ? ?Vaginal Bleeding ? ? ?HPI ? ?Kellie Lowery is a 26 y.o. female G1P0 presents to the ED for evaluation of vaginal bleeding with onset today.  Patient is approximately 12 weeks by dates, who presents to the ED endorsing some lower abdominal discomfort and some spotty bleeding.  She reports only dark blood noted on the toilet paper when she wipes.  She denies any previous concerns related to the pregnancy.  She is receiving prenatal care at the [facility] department.  She denies any fever, chills, nausea, vomiting, chest pain, or shortness of breath. ? ?She had a routine ultrasound 2 days ago ordered by the health department which shows a normal IUP at 10+ weeks. ? ?Physical Exam  ? ?Triage Vital Signs: ?ED Triage Vitals  ?Enc Vitals Group  ?   BP 10/07/21 1932 123/80  ?   Pulse Rate 10/07/21 1932 89  ?   Resp 10/07/21 1932 18  ?   Temp 10/07/21 1932 99 ?F (37.2 ?C)  ?   Temp Source 10/07/21 1932 Oral  ?   SpO2 10/07/21 1932 98 %  ?   Weight 10/07/21 1933 157 lb (71.2 kg)  ?   Height 10/07/21 1933 5\' 4"  (1.626 m)  ?   Head Circumference --   ?   Peak Flow --   ?   Pain Score 10/07/21 1932 7  ?   Pain Loc --   ?   Pain Edu? --   ?   Excl. in GC? --   ? ? ?Most recent vital signs: ?Vitals:  ? 10/07/21 1932 10/07/21 2153  ?BP: 123/80 110/73  ?Pulse: 89 79  ?Resp: 18 16  ?Temp: 99 ?F (37.2 ?C) 98 ?F (36.7 ?C)  ?SpO2: 98% 100%  ? ? ?General Awake, no distress.  ?CV:  Good peripheral perfusion.  ?RESP:  Normal effort.  ?ABD:  No distention. Soft, nontender ?GU:  deferred by patient ? ? ?ED Results / Procedures / Treatments  ? ?Labs ?(all labs ordered are listed, but only abnormal results are displayed) ?Labs Reviewed  ?CBC - Abnormal; Notable for the following components:  ?    Result Value  ? WBC 14.9 (*)   ? RBC 3.81  (*)   ? Hemoglobin 11.4 (*)   ? HCT 33.1 (*)   ? All other components within normal limits  ?BASIC METABOLIC PANEL - Abnormal; Notable for the following components:  ? Glucose, Bld 107 (*)   ? All other components within normal limits  ?HCG, QUANTITATIVE, PREGNANCY - Abnormal; Notable for the following components:  ? hCG, Beta 2154 Francene Finders (*)   ? All other components within normal limits  ?URINALYSIS, ROUTINE W REFLEX MICROSCOPIC - Abnormal; Notable for the following components:  ? Color, Urine YELLOW (*)   ? APPearance HAZY (*)   ? Hgb urine dipstick MODERATE (*)   ? Leukocytes,Ua TRACE (*)   ? All other components within normal limits  ?ABO/RH  ? ? ? ?EKG ? ? ?RADIOLOGY ? ?I personally viewed and evaluated these images as part of my medical decision making, as well as reviewing the written report by the radiologist. ? ?ED Provider Interpretation: normal IUP} ? ?001,749 OB Comp Less 14 Wks ? ?Result Date: 10/07/2021 ?CLINICAL DATA:  Vaginal  bleeding EXAM: OBSTETRIC <14 WK ULTRASOUND TECHNIQUE: Transabdominal ultrasound was performed for evaluation of the gestation as well as the maternal uterus and adnexal regions. COMPARISON:  10/05/2021 FINDINGS: Intrauterine gestational sac: Single intrauterine gestational sac Yolk sac:  Visualized Embryo:  Visualized Cardiac Activity: Visualized Heart Rate: 160 bpm CRL:   36.6 mm   10 w 4 d                  [place] EDC: 05/01/2022 Subchorionic hemorrhage:  None visualized. Maternal uterus/adnexae: Ovaries are within normal limits. Left ovary measures 1.4 x 3.1 x 1.8 cm. The right ovary measures 3.1 x 2.9 x 1.6 cm. No significant free fluid IMPRESSION: Single viable intrauterine pregnancy as above. No specific abnormality is seen Electronically Signed   By: Jasmine Pang M.D.   On: 10/07/2021 20:56   ? ? ?PROCEDURES: ? ?Critical Care performed: No ? ?Procedures ? ? ?MEDICATIONS ORDERED IN ED: ?Medications - No data to display ? ? ?IMPRESSION / MDM / ASSESSMENT AND PLAN / ED  COURSE  ?I reviewed the triage vital signs and the nursing notes. ?             ?               ? ?Differential diagnosis includes, but is not limited to, threatened miscarriage, incomplete miscarriage, normal bleeding from an early trimester pregnancy, ectopic pregnancy, blighted ovum, vaginal/cervical trauma, subchorionic hemorrhage/hematoma, etc. ? ? ?Patient to the ED for evaluation of some dark vaginal bleeding and spotting with onset today.  Patient presents to the ED in no acute distress.  Her evaluation is reassuring as it shows no acute critical anemia or acute leukocytosis.  No evidence of UTI on UA.  Pelvic ultrasound confirms a single IUP with normal cardiac activity.  No subchorionic hemorrhage is appreciated.  This is based on my review of images and confirmed by radiologist report.  Patient's diagnosis is consistent with first trimester bleeding.  Patient is reassured by her normal work-up and ultrasound.  No signs of any serious or concerning causes for the patient's scant bleeding.  Patient will be discharged home with instructions to follow-up with her OB provider as needed or otherwise directed. Patient is given ED precautions to return to the ED for any worsening or new symptoms. ? ? ?FINAL CLINICAL IMPRESSION(S) / ED DIAGNOSES  ? ?Final diagnoses:  ?Vaginal bleeding in pregnancy  ? ? ? ?Rx / DC Orders  ? ?ED Discharge Orders   ? ? None  ? ?  ? ? ? ?Note:  This document was prepared using Dragon voice recognition software and may include unintentional dictation errors. ? ?  ?Lissa Hoard, PA-C ?10/07/21 2315 ? ?  ?Lissa Hoard, PA-C ?10/07/21 2316 ? ?  ?Concha Se, MD ?10/08/21 1514 ? ?

## 2021-10-07 NOTE — ED Triage Notes (Signed)
Pt presents to ER c/o vaginal bleeding that started today.  Pt states she is appx [redacted] weeks pregnant G1P0 who gets prenatal care at the heal[facility] de[facility].  Pt also endorses some lower abd pain and spotty bleeding.  Pt denies any previous issues with this pregnancy.  Pt is A&O x4 at this time in NAD in triage.   ?

## 2021-10-21 NOTE — Addendum Note (Signed)
Addended by: Jeananne Bedwell on: 10/21/2021 02:20 PM ? ? Modules accepted: Orders ? ?

## 2021-10-25 ENCOUNTER — Ambulatory Visit: Payer: Medicaid Other | Admitting: Nurse Practitioner

## 2021-10-25 VITALS — BP 111/68 | HR 91 | Temp 98.1°F | Wt 162.6 lb

## 2021-10-25 DIAGNOSIS — Z3482 Encounter for supervision of other normal pregnancy, second trimester: Secondary | ICD-10-CM | POA: Diagnosis not present

## 2021-10-25 DIAGNOSIS — F419 Anxiety disorder, unspecified: Secondary | ICD-10-CM

## 2021-10-25 DIAGNOSIS — E663 Overweight: Secondary | ICD-10-CM

## 2021-10-25 NOTE — Progress Notes (Signed)
Vaginal bleeding 10/07/2021. See ER note.  04/25 U/S. Denies vaginal bleeding since 04/27. Sent to lab for blood draw.  ?

## 2021-10-25 NOTE — Progress Notes (Signed)
[name]  PRENATAL VISIT NOTE  Subjective:  Kellie Lowery is a 26 y.o. [name] at [redacted]w[redacted]d being seen today for ongoing prenatal care.  She is currently monitored for the following issues for this low-risk pregnancy and has Hx of self mutilation--cutter age 59-15; Overweight BMI=27.4; Anxiety dx'd 2020; and Supervision of other normal pregnancy, antepartum on their problem list.  Patient reports no complaints.  Contractions: Not present. Vag. Bleeding: None.  Movement: Absent. Denies leaking of fluid/ROM.   The following portions of the patient's history were reviewed and updated as appropriate: allergies, current medications, past family history, past medical history, past social history, past surgical history and problem list. Problem list updated.  Objective:   Vitals:   10/25/21 1545  BP: 111/68  Pulse: 91  Temp: 98.1 F (36.7 C)  Weight: 162 lb 9.6 oz (73.8 kg)    Fetal Status: Fetal Heart Rate (bpm): not heard Fundal Height: 14 cm Movement: Absent     General:  Alert, oriented and cooperative. Patient is in no acute distress.  Skin: Skin is warm and dry. No rash noted.   Cardiovascular: Normal heart rate noted  Respiratory: Normal respiratory effort, no problems with respiration noted  Abdomen: Soft, gravid, appropriate for gestational age.  Pain/Pressure: Absent     Pelvic: Cervical exam deferred        Extremities: Normal range of motion.  Edema: None  Mental Status: Normal mood and affect. Normal behavior. Normal judgment and thought content.   Assessment and Plan:  Pregnancy: [name] at [redacted]w[redacted]d  1. Encounter for supervision of other normal pregnancy in second trimester -26 year old female in clinic today for prenatal care.  Patient had initial on 09/27/21.  Patient reports on 10/07/21 she began to have some vaginal bleeding.  She reported to ED for evaluation. U/S performed, [redacted]w[redacted]d, single viable intrauterine  pregnancy noted.  Patient states she only experienced bleeding for one day.   Bleeding has subsided and experiences some cramping at times.   -Fetal heart tone not heard today.  Due to history of bleeding and fetal heart tones not heard Beta hCG obtained.  Patient to report to hospital if any bleeding or abdominal pain is noted. Will follow up with patient once results are in for additional follow up and needs.   -WBC also collected per previous visit. -Patient taking PNV daily.  - Beta hCG quant (ref lab) - WBC  2. Anxiety dx'd 2020 -Patient denies anxiety.    3. Overweight BMI=27.4 -9 lb 9.6 oz (4.355 kg)  -Patient encouraged to begin to walking and frequent exercises at least 3 times a week.  Limit food high in sugar, fats, and carbs and to drink at least 6-8 glasses of water.     Term labor symptoms and general obstetric precautions including but not limited to vaginal bleeding, contractions, leaking of fluid and fetal movement were reviewed in detail with the patient. Please refer to After Visit Summary for other counseling recommendations.   Return in about 4 weeks (around 11/22/2021) for Routine prenatal care visit.    [name], FNP

## 2021-10-26 LAB — BETA HCG QUANT (REF LAB): hCG Quant: 50440 m[IU]/mL

## 2021-10-26 LAB — WBC: WBC: 12.6 10*3/uL — ABNORMAL HIGH (ref 3.4–10.8)

## 2021-10-27 ENCOUNTER — Telehealth: Payer: Self-pay | Admitting: Family Medicine

## 2021-10-27 NOTE — Telephone Encounter (Signed)
Patient called to inquire about her labs from 10-25-2021.  Labs have not been reviewed by a provider. Explained to patient we would call her once the provider reviews the lab (probably tomorrow).  Hart Carwin, RN ? ?

## 2021-10-27 NOTE — Telephone Encounter (Signed)
Pt left a voicemail on my phone. Pt stated that she has been waiting on a phone call and asked that a [place] nurse call her.  ?

## 2021-10-29 ENCOUNTER — Telehealth: Payer: Self-pay | Admitting: Family Medicine

## 2021-10-29 ENCOUNTER — Encounter: Payer: Self-pay | Admitting: Nurse Practitioner

## 2021-10-29 NOTE — Progress Notes (Signed)
Beta hCG labs reviewed, 10w based on Beta hCG.  Will bring patient back next week to assess fetal heart tones and Beta hCG if necessary.  Gregary Cromer, FNP

## 2021-10-29 NOTE — Telephone Encounter (Signed)
Pt states she received a call from a nurse telling her to call us and make a follow up appt for next week. I scheduled her for Monday 11/01/21.

## 2021-11-01 ENCOUNTER — Ambulatory Visit: Payer: Medicaid Other | Admitting: Nurse Practitioner

## 2021-11-01 ENCOUNTER — Encounter: Payer: Self-pay | Admitting: Nurse Practitioner

## 2021-11-01 DIAGNOSIS — Z3482 Encounter for supervision of other normal pregnancy, second trimester: Secondary | ICD-10-CM | POA: Diagnosis not present

## 2021-11-01 DIAGNOSIS — Z3480 Encounter for supervision of other normal pregnancy, unspecified trimester: Secondary | ICD-10-CM

## 2021-11-01 NOTE — Progress Notes (Unsigned)
[facility] Maternal Health Clinic  PRENATAL VISIT NOTE  Subjective:  Kellie Lowery is a 26 y.o. [name] at [redacted]w[redacted]d being seen today for ongoing prenatal care.  She is currently monitored for the following issues for this low-risk pregnancy and has Hx of self mutilation--cutter age 75-15; Overweight BMI=27.4; Anxiety dx'd 2020; and Supervision of other normal pregnancy, antepartum on their problem list.  Patient reports no complaints.  Contractions: Not present. Vag. Bleeding: None.  Movement: Absent. Denies leaking of fluid/ROM.   The following portions of the patient's history were reviewed and updated as appropriate: allergies, current medications, past family history, past medical history, past social history, past surgical history and problem list. Problem list updated.  Objective:   Vitals:   11/01/21 1028  BP: 112/64  Pulse: 96  Temp: 97.6 F (36.4 C)  Weight: 163 lb 12.8 oz (74.3 kg)    Fetal Status: Fetal Heart Rate (bpm): 160 Fundal Height: 14 cm Movement: Absent     General:  Alert, oriented and cooperative. Patient is in no acute distress.  Skin: Skin is warm and dry. No rash noted.   Cardiovascular: Normal heart rate noted  Respiratory: Normal respiratory effort, no problems with respiration noted  Abdomen: Soft, gravid, appropriate for gestational age.  Pain/Pressure: Absent     Pelvic: Cervical exam deferred        Extremities: Normal range of motion.  Edema: None  Mental Status: Normal mood and affect. Normal behavior. Normal judgment and thought content.   Assessment and Plan:  Pregnancy: [name] at [redacted]w[redacted]d  1. Supervision of other normal pregnancy, antepartum -26 year old female in clinic today for prenatal care and follow up of fetal heart tones.  Patient had a visit on 10/25/21 where fetal heart tones were not heard.  Patient had reported bleeding on 10/07/21 with an ED visit.  Viable pregnancy visualized on U/S.  Beta HCG levels  obtained 10/25/21= 50,440.  Fetal heart tones heard today=160.  No additional follow up needed.  Patient to report to ED if symptoms of bleeding or cramping occur.  Patient to return to clinic in 4 weeks.     Term labor symptoms and general obstetric precautions including but not limited to vaginal bleeding, contractions, leaking of fluid and fetal movement were reviewed in detail with the patient. Please refer to After Visit Summary for other counseling recommendations.   Return in about 4 weeks (around 11/29/2021) for Routine prenatal care visit.  Future Appointments  Date Time Provider Department Center  11/30/2021  8:20 AM [facility] PROVIDER [facility] None    [name], FNP

## 2021-11-01 NOTE — Progress Notes (Unsigned)
Requested she bring TUMS dosage at next appt and she agrees to do so. To [facility] ED 10/07/2021 for vaginal bleeding evaluation and US done. Jossie Ng, RN

## 2021-11-18 NOTE — Addendum Note (Signed)
Addended by: Heywood Bene on: 11/18/2021 03:21 PM   Modules accepted: Orders

## 2021-11-30 ENCOUNTER — Ambulatory Visit: Payer: Self-pay | Admitting: Advanced Practice Midwife

## 2021-11-30 VITALS — BP 114/68 | HR 98 | Temp 96.9°F | Wt 171.4 lb

## 2021-11-30 DIAGNOSIS — Z3480 Encounter for supervision of other normal pregnancy, unspecified trimester: Secondary | ICD-10-CM

## 2021-11-30 DIAGNOSIS — F419 Anxiety disorder, unspecified: Secondary | ICD-10-CM

## 2021-11-30 DIAGNOSIS — Z3482 Encounter for supervision of other normal pregnancy, second trimester: Secondary | ICD-10-CM

## 2021-11-30 NOTE — Progress Notes (Addendum)
Patient here for MH RV at 18 4/7. Desires genetic screening today, Materni 21 and AFP only. WBC recheck today.Marland Kitchen Marland KitchenBurt Knack, RN

## 2021-11-30 NOTE — Progress Notes (Signed)
[facility]  PRENATAL VISIT NOTE  Subjective:  Kellie Lowery is a 26 y.o. [name] at [redacted]w[redacted]d being seen today for ongoing prenatal care.  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  Patient reports no complaints.  Contractions: Not present. Vag. Bleeding: None.  Movement: Absent. Denies leaking of fluid/ROM.   The following portions of the patient's history were reviewed and updated as appropriate: allergies, current medications, past family history, past medical history, past social history, past surgical history and problem list. Problem list updated.  Objective:   Vitals:   11/30/21 0828  BP: 114/68  Pulse: 98  Temp: (!) 96.9 F (36.1 C)  Weight: 171 lb 6.4 oz (77.7 kg)    Fetal Status: Fetal Heart Rate (bpm): 150 Fundal Height: 20 cm Movement: Absent     General:  Alert, oriented and cooperative. Patient is in no acute distress.  Skin: Skin is warm and dry. No rash noted.   Cardiovascular: Normal heart rate noted  Respiratory: Normal respiratory effort, no problems with respiration noted  Abdomen: Soft, gravid, appropriate for gestational age.  Pain/Pressure: Absent     Pelvic: Cervical exam deferred        Extremities: Normal range of motion.  Edema: None  Mental Status: Normal mood and affect. Normal behavior. Normal judgment and thought content.   Assessment and Plan:  Pregnancy: [name] at [redacted]w[redacted]d  1. Supervision of other normal pregnancy, antepartum 18 lb 6.4 oz (8.346 kg) Pt accepts and desires NIPS and AFP only Walking 4x/wk x 25 min Not working Living with FOB, brother and his wife and 3 kids Pap 08/12/20=neg Reviewed 10/07/21 u/s at 10 4/7 Reviewed 11/04/21 u/s at 10 2/7 WBC ordered as prior were elevated  (10/25/21=12.6, 10/07/21=14.9) Anatomy u/s ordered - MaterniT21 PLUS Core - AFP, Serum, Open Spina Bifida - WBC - US OB Comp + 14 Wk; Future  2. Anxiety dx'd 2020 Has cried 4x this pregnancy but admits to becoming emotional easily Please give contact info for [name], LCSW    Preterm labor symptoms and general obstetric precautions including but not limited to vaginal bleeding, contractions, leaking of fluid and fetal movement were reviewed in detail with the patient. Please refer to After Visit Summary for other counseling recommendations.  No follow-ups on file.  No future appointments.  Alberteen Spindle, CNM

## 2021-12-01 ENCOUNTER — Telehealth: Payer: Self-pay

## 2021-12-01 NOTE — Telephone Encounter (Signed)
TC to patient to inform of [facility] anatomy U/S on 12/07/21 at 1:00. Patient counseled to arrive at 12:45, may take an adult with her, but no children. Patient states understanding.Burt Knack, RN

## 2021-12-05 LAB — MATERNIT 21 PLUS CORE, BLOOD
Fetal Fraction: 8
Result (T21): NEGATIVE
Trisomy 13 (Patau syndrome): NEGATIVE
Trisomy 18 (Edwards syndrome): NEGATIVE
Trisomy 21 (Down syndrome): NEGATIVE

## 2021-12-05 LAB — AFP, SERUM, OPEN SPINA BIFIDA
AFP MoM: 1.06
AFP Value: 47.1 ng/mL
Gest. Age on Collection Date: 18.6 weeks
Maternal Age At EDD: 24.2 yr
OSBR Risk 1 IN: 10000
Test Results:: NEGATIVE
Weight: 171 [lb_av]

## 2021-12-05 LAB — WBC: WBC: 12 10*3/uL — ABNORMAL HIGH (ref 3.4–10.8)

## 2021-12-07 ENCOUNTER — Encounter: Payer: Self-pay | Admitting: Advanced Practice Midwife

## 2021-12-07 ENCOUNTER — Ambulatory Visit
Admission: RE | Admit: 2021-12-07 | Discharge: 2021-12-07 | Disposition: A | Discharge status: Home / Self Care | Payer: Self-pay | Source: Ambulatory Visit | Attending: Advanced Practice Midwife | Admitting: Advanced Practice Midwife

## 2021-12-07 DIAGNOSIS — Z3689 Encounter for other specified antenatal screening: Secondary | ICD-10-CM | POA: Insufficient documentation

## 2021-12-07 DIAGNOSIS — Z3A19 19 weeks gestation of pregnancy: Secondary | ICD-10-CM | POA: Insufficient documentation

## 2021-12-07 DIAGNOSIS — Z3480 Encounter for supervision of other normal pregnancy, unspecified trimester: Secondary | ICD-10-CM | POA: Insufficient documentation

## 2021-12-23 NOTE — Addendum Note (Signed)
Addended by: Heywood Bene on: 12/23/2021 12:32 PM   Modules accepted: Orders

## 2021-12-28 ENCOUNTER — Ambulatory Visit: Payer: Self-pay | Admitting: Advanced Practice Midwife

## 2021-12-28 VITALS — BP 108/65 | HR 83 | Temp 97.5°F | Wt 181.2 lb

## 2021-12-28 DIAGNOSIS — F419 Anxiety disorder, unspecified: Secondary | ICD-10-CM

## 2021-12-28 DIAGNOSIS — Z3482 Encounter for supervision of other normal pregnancy, second trimester: Secondary | ICD-10-CM

## 2021-12-28 MED ORDER — PRENATAL VITAMIN 27-0.8 MG PO TABS: 1.0000 | ORAL_TABLET | Freq: Every day | ORAL | 0 refills | Status: DC

## 2021-12-28 NOTE — Progress Notes (Signed)
PNV given to client per request of E. Sciora CNM. Client states she spoke with [name] LCSW this am who plans to call her back on partner's phone as hers is broken. Number to contact client for now is [phone]. Jossie Ng, RN

## 2021-12-28 NOTE — Progress Notes (Signed)
Patient is here at 22w 2d for [facility].   CM[facility]P high risk forms filled out today. PHQ9 = 5.   Patient states she is taking pNV only daily and no refill needed today.  Earlyne Iba, RN

## 2021-12-28 NOTE — Progress Notes (Signed)
[facility]  PRENATAL VISIT NOTE  Subjective:  Kellie Lowery is a 26 y.o. [name] at [redacted]w[redacted]d being seen today for ongoing prenatal care.  She is currently monitored for the following issues for this low-risk pregnancy and has Hx of self mutilation--cutter age 69-15; Overweight BMI=27.4; Anxiety dx'd 2020; and Supervision of other normal pregnancy, antepartum on their problem list.  Patient reports no complaints.  Contractions: Not present. Vag. Bleeding: None.  Movement: Present. Denies leaking of fluid/ROM.   The following portions of the patient's history were reviewed and updated as appropriate: allergies, current medications, past family history, past medical history, past social history, past surgical history and problem list. Problem list updated.  Objective:   Vitals:   12/28/21 1015  BP: 108/65  Pulse: 83  Temp: (!) 97.5 F (36.4 C)  Weight: 181 lb 3.2 oz (82.2 kg)    Fetal Status: Fetal Heart Rate (bpm): 150 Fundal Height: 23 cm Movement: Present     General:  Alert, oriented and cooperative. Patient is in no acute distress.  Skin: Skin is warm and dry. No rash noted.   Cardiovascular: Normal heart rate noted  Respiratory: Normal respiratory effort, no problems with respiration noted  Abdomen: Soft, gravid, appropriate for gestational age.  Pain/Pressure: Absent     Pelvic: Cervical exam deferred        Extremities: Normal range of motion.  Edema: None  Mental Status: Normal mood and affect. Normal behavior. Normal judgment and thought content.   Assessment and Plan:  Pregnancy: [name] at [redacted]w[redacted]d  1. Anxiety dx'd 2020 Her phone broke so couldn't call [name] for apt--pt given clinic phone to call for apt and referral made - Ambulatory referral to Behavioral Health  2. Prenatal care, subsequent pregnancy, second trimester 28 lb 3.2 oz (12.8 kg) 10 lb wt gain in past 4 wks Walking or swimming 3x/wk x 20  min Reviewed 12/07/21 u/s at 19.0 wks with no previa, anterior placenta, AFI wnl, anatomy wnl, 3VC NIPS neg on 11/30/21 AFP only neg on 11/30/21  Not working - Prenatal Vit-Fe Fumarate-FA (PRENATAL VITAMIN) 27-0.8 MG TABS; Take 1 tablet by mouth daily at 6 (six) AM.  Dispense: 100 tablet; Refill: 0'   Preterm labor symptoms and general obstetric precautions including but not limited to vaginal bleeding, contractions, leaking of fluid and fetal movement were reviewed in detail with the patient. Please refer to After Visit Summary for other counseling recommendations.  Return in about 4 weeks (around 01/25/2022) for routine PNC.  No future appointments.  Alberteen Spindle, CNM

## 2022-02-01 ENCOUNTER — Telehealth: Payer: Self-pay | Admitting: Family Medicine

## 2022-02-01 ENCOUNTER — Telehealth: Payer: Self-pay

## 2022-02-01 ENCOUNTER — Ambulatory Visit: Payer: Self-pay | Admitting: Advanced Practice Midwife

## 2022-02-01 VITALS — BP 106/60 | HR 91 | Wt 186.4 lb

## 2022-02-01 DIAGNOSIS — E663 Overweight: Secondary | ICD-10-CM

## 2022-02-01 DIAGNOSIS — Z3482 Encounter for supervision of other normal pregnancy, second trimester: Secondary | ICD-10-CM

## 2022-02-01 DIAGNOSIS — Z3483 Encounter for supervision of other normal pregnancy, third trimester: Secondary | ICD-10-CM

## 2022-02-01 DIAGNOSIS — Z3480 Encounter for supervision of other normal pregnancy, unspecified trimester: Secondary | ICD-10-CM

## 2022-02-01 LAB — HEMOGLOBIN, FINGERSTICK: Hemoglobin: 11.2 g/dL (ref 11.1–15.9)

## 2022-02-01 NOTE — Progress Notes (Signed)
[facility]  PRENATAL VISIT NOTE  Subjective:  Kellie Lowery is a 26 y.o. [name] at [redacted]w[redacted]d being seen today for ongoing prenatal care.  She is currently monitored for the following issues for this low-risk pregnancy and has Hx of self mutilation--cutter age 67-15; Overweight BMI=27.4; Anxiety dx'd 2020; and Supervision of other normal pregnancy, antepartum on their problem list.  Patient reports no complaints.  Contractions: Not present. Vag. Bleeding: None.  Movement: Present. Denies leaking of fluid/ROM.   The following portions of the patient's history were reviewed and updated as appropriate: allergies, current medications, past family history, past medical history, past social history, past surgical history and problem list. Problem list updated.  Objective:   Vitals:   02/01/22 1434  BP: 106/60  Pulse: 91  Weight: 186 lb 6 oz (84.5 kg)    Fetal Status: Fetal Heart Rate (bpm): 155 Fundal Height: 27 cm Movement: Present     General:  Alert, oriented and cooperative. Patient is in no acute distress.  Skin: Skin is warm and dry. No rash noted.   Cardiovascular: Normal heart rate noted  Respiratory: Normal respiratory effort, no problems with respiration noted  Abdomen: Soft, gravid, appropriate for gestational age.  Pain/Pressure: Absent     Pelvic: Cervical exam deferred        Extremities: Normal range of motion.  Edema: None  Mental Status: Normal mood and affect. Normal behavior. Normal judgment and thought content.   Assessment and Plan:  Pregnancy: [name] at [redacted]w[redacted]d  1. Prenatal care, subsequent pregnancy in second trimester  - Hemoglobin, venipuncture - HIV-1/HIV-2 Qualitative RNA - RPR - Glucose, 1 hour - US OB Follow Up; Future  2. Overweight BMI=27.4 5 lb wt gain in past 5 weeks Walking 2-3x/wk x 20 min To watch bread/ric/tortilla intake  3. Supervision of other normal pregnancy, antepartum 33 lb 6  oz (15.1 kg) Requesting f/u u/s for poor heart visualization on 12/07/21 u/s 1 hour glucola today Not working     Preterm labor symptoms and general obstetric precautions including but not limited to vaginal bleeding, contractions, leaking of fluid and fetal movement were reviewed in detail with the patient. Please refer to After Visit Summary for other counseling recommendations.  Return in about 2 weeks (around 02/15/2022) for routine PNC.  Future Appointments  Date Time Provider Department Center  02/15/2022  2:00 PM [facility] PROVIDER [facility] None    Alberteen Spindle, CNM

## 2022-02-01 NOTE — Telephone Encounter (Signed)
Call to patient to let her know of f/u US on 02/07/22 for 1045 arrival at [address] [place], [place].   Earlyne Iba, RN

## 2022-02-01 NOTE — Progress Notes (Signed)
Pt presents today at 27.2 weeks for routine prenatal care; denies any issues today; states baby moving well; denies any vaginal bleeding or uc's; states she is just sleepy; 27 week labs ordered (Epic down) HGb, HIV, RPR, 1 hour GTT; pt tolerated glucola well; denies any questions.Florina Ou, RN   Hgb=11.2; no intervention needed per standing orders; instructed will call pt with abnormal results of her 1 hour GTT and any further instructions if needed and pt verb u/o.Florina Ou, RN

## 2022-02-01 NOTE — Telephone Encounter (Signed)
Pt was having problems with her cellphone and wants to know if the referral for her ultrasound was done or not. Please call her to let her know, thanks :)

## 2022-02-02 LAB — RPR: RPR Ser Ql: NONREACTIVE

## 2022-02-02 LAB — HIV-1/HIV-2 QUALITATIVE RNA
HIV-1 RNA, Qualitative: NONREACTIVE
HIV-2 RNA, Qualitative: NONREACTIVE

## 2022-02-02 LAB — GLUCOSE, 1 HOUR GESTATIONAL: Gestational Diabetes Screen: 101 mg/dL (ref 70–139)

## 2022-02-02 NOTE — Telephone Encounter (Signed)
Per Epic appointments, patient has an U/S follow-up scheduled for 02-07-2022 at 11 am (10:45 arrival) and per pink sticky note by Renea Ee, RN: patient is aware of this appointment.  Hart Carwin, RN

## 2022-02-07 ENCOUNTER — Ambulatory Visit
Admission: RE | Admit: 2022-02-07 | Discharge: 2022-02-07 | Disposition: A | Discharge status: Home / Self Care | Payer: Self-pay | Source: Ambulatory Visit | Attending: Advanced Practice Midwife | Admitting: Advanced Practice Midwife

## 2022-02-07 DIAGNOSIS — Z3689 Encounter for other specified antenatal screening: Secondary | ICD-10-CM | POA: Insufficient documentation

## 2022-02-07 DIAGNOSIS — Z3A28 28 weeks gestation of pregnancy: Secondary | ICD-10-CM | POA: Insufficient documentation

## 2022-02-07 DIAGNOSIS — Z3482 Encounter for supervision of other normal pregnancy, second trimester: Secondary | ICD-10-CM | POA: Insufficient documentation

## 2022-02-08 ENCOUNTER — Encounter: Payer: Self-pay | Admitting: Advanced Practice Midwife

## 2022-02-15 ENCOUNTER — Ambulatory Visit: Payer: Self-pay

## 2022-02-15 NOTE — Progress Notes (Deleted)
[facility] Department M[facility] Clinic  PRENATAL VISIT NOTE  Subjective:  Kellie Lowery is a 26 y.o. [name] at [redacted]w[redacted]d being seen today for ongoing prenatal care.  She is currently monitored for the following issues for this {Blank single:19197::"high-risk","low-risk"} pregnancy and has Hx of self mutilation--cutter age 62-15; Overweight BMI=27.4; Anxiety dx'd 2020; and Supervision of other normal pregnancy, antepartum on their problem list.  Patient reports {sx:14538}.   .  .   . ***Denies leaking of fluid/ROM.   The following portions of the patient's history were reviewed and updated as appropriate: allergies, current medications, past family history, past medical history, past social history, past surgical history and problem list. Problem list updated.  Objective:  There were no vitals filed for this visit.  Fetal Status:           General:  Alert, oriented and cooperative. Patient is in no acute distress.  Skin: Skin is warm and dry. No rash noted.   Cardiovascular: Normal heart rate noted  Respiratory: Normal respiratory effort, no problems with respiration noted  Abdomen: Soft, gravid, appropriate for gestational age.        Pelvic: {Blank single:19197::"Cervical exam performed","Cervical exam deferred"}        Extremities: Normal range of motion.     Mental Status: Normal mood and affect. Normal behavior. Normal judgment and thought content.   Assessment and Plan:  Pregnancy: [name] at [redacted]w[redacted]d  1. Supervision of other normal pregnancy, antepartum ***  2. Overweight BMI=27.4 ***  3. Anxiety dx'd 2020 ***   {Blank single:19197::"Term","Preterm"} labor symptoms and general obstetric precautions including but not limited to vaginal bleeding, contractions, leaking of fluid and fetal movement were reviewed in detail with the patient. Please refer to After Visit Summary for other counseling recommendations.  No follow-ups on file.  Future  Appointments  Date Time Provider Department Center  02/15/2022  2:00 PM [facility] PROVIDER [facility] None    Federico Flake, MD

## 2022-02-21 ENCOUNTER — Ambulatory Visit: Payer: Self-pay | Admitting: Advanced Practice Midwife

## 2022-02-21 VITALS — BP 103/67 | HR 87 | Temp 97.5°F | Wt 186.8 lb

## 2022-02-21 DIAGNOSIS — Z23 Encounter for immunization: Secondary | ICD-10-CM

## 2022-02-21 DIAGNOSIS — Z3483 Encounter for supervision of other normal pregnancy, third trimester: Secondary | ICD-10-CM

## 2022-02-21 DIAGNOSIS — F419 Anxiety disorder, unspecified: Secondary | ICD-10-CM

## 2022-02-21 DIAGNOSIS — Z9152 Personal history of nonsuicidal self-harm: Secondary | ICD-10-CM

## 2022-02-21 DIAGNOSIS — Z3480 Encounter for supervision of other normal pregnancy, unspecified trimester: Secondary | ICD-10-CM

## 2022-02-21 NOTE — Progress Notes (Signed)
TDAP VIS given.

## 2022-02-21 NOTE — Addendum Note (Signed)
Addended by: Delynn Flavin A on: 02/21/2022 09:14 AM   Modules accepted: Orders

## 2022-02-21 NOTE — Progress Notes (Signed)
[facility] M[facility] Health Clinic  PRENATAL VISIT NOTE  Subjective:  Kellie Lowery is a 26 y.o. [name] at [redacted]w[redacted]d being seen today for ongoing prenatal care.  She is currently monitored for the following issues for this low-risk pregnancy and has Hx of self mutilation--cutter age 23-15; Overweight BMI=27.4; Anxiety dx'd 2020; and Supervision of other normal pregnancy, antepartum on their problem list.  Patient reports no complaints.  Contractions: Not present. Vag. Bleeding: None.  Movement: Present. Denies leaking of fluid/ROM.   The following portions of the patient's history were reviewed and updated as appropriate: allergies, current medications, past family history, past medical history, past social history, past surgical history and problem list. Problem list updated.  Objective:   Vitals:   02/21/22 0829  BP: 103/67  Pulse: 87  Temp: (!) 97.5 F (36.4 C)  Weight: 186 lb 12.8 oz (84.7 kg)    Fetal Status: Fetal Heart Rate (bpm): 140 Fundal Height: 30 cm Movement: Present     General:  Alert, oriented and cooperative. Patient is in no acute distress.  Skin: Skin is warm and dry. No rash noted.   Cardiovascular: Normal heart rate noted  Respiratory: Normal respiratory effort, no problems with respiration noted  Abdomen: Soft, gravid, appropriate for gestational age.  Pain/Pressure: Absent     Pelvic: Cervical exam deferred        Extremities: Normal range of motion.  Edema: None  Mental Status: Normal mood and affect. Normal behavior. Normal judgment and thought content.   Assessment and Plan:  Pregnancy: [name] at [redacted]w[redacted]d  1. Supervision of other normal pregnancy, antepartum Not working Has crib Walking 2x/wk x 20+ minutes 1 hour glucola 02/01/22=101 Last WBC 11/30/21=12.0  2. Anxiety dx'd 2020 States has not made apt with [name], LCSW yet; contact card given to pt again who promises to make apt Poor sleep, +cry 1x/wk, +moody,  irritable, easily angered  3. Hx of self mutilation--cutter age 3-15    Preterm labor symptoms and general obstetric precautions including but not limited to vaginal bleeding, contractions, leaking of fluid and fetal movement were reviewed in detail with the patient. Please refer to After Visit Summary for other counseling recommendations.  Return in about 2 weeks (around 03/07/2022) for routine PNC.  Future Appointments  Date Time Provider Department Center  03/07/2022  9:00 AM [facility] PROVIDER [facility] None    Alberteen Spindle, CNM

## 2022-03-07 ENCOUNTER — Ambulatory Visit: Payer: Self-pay

## 2022-03-07 ENCOUNTER — Telehealth: Payer: Self-pay

## 2022-03-07 NOTE — Telephone Encounter (Signed)
Due to provider availability, client's appt needs to be rescheduled. Call to client and female [place] speaker answered phone. Above explained and he stated would contact client to have her reschedule appt. Rich Number, RN

## 2022-03-07 NOTE — Telephone Encounter (Signed)
Client rescheduled [facility] appt for 03/09/2022. Rich Number, RN

## 2022-03-09 ENCOUNTER — Ambulatory Visit: Payer: Self-pay | Admitting: Advanced Practice Midwife

## 2022-03-09 VITALS — BP 106/63 | HR 93 | Temp 98.3°F | Wt 190.6 lb

## 2022-03-09 DIAGNOSIS — Z3483 Encounter for supervision of other normal pregnancy, third trimester: Secondary | ICD-10-CM

## 2022-03-09 DIAGNOSIS — F419 Anxiety disorder, unspecified: Secondary | ICD-10-CM

## 2022-03-09 DIAGNOSIS — Z3480 Encounter for supervision of other normal pregnancy, unspecified trimester: Secondary | ICD-10-CM

## 2022-03-09 DIAGNOSIS — E663 Overweight: Secondary | ICD-10-CM

## 2022-03-09 NOTE — Progress Notes (Signed)
[facility]  PRENATAL VISIT NOTE  Subjective:  Kellie Lowery is a 26 y.o. [name] at [redacted]w[redacted]d being seen today for ongoing prenatal care.  She is currently monitored for the following issues for this low-risk pregnancy and has Hx of self mutilation--cutter age 17-15; Overweight BMI=27.4; Anxiety dx'd 2020; and Supervision of other normal pregnancy, antepartum on their problem list.  Patient reports no complaints.  Contractions: Not present. Vag. Bleeding: None.  Movement: Present. Denies leaking of fluid/ROM.   The following portions of the patient's history were reviewed and updated as appropriate: allergies, current medications, past family history, past medical history, past social history, past surgical history and problem list. Problem list updated.  Objective:   Vitals:   03/09/22 1448  BP: 106/63  Pulse: 93  Temp: 98.3 F (36.8 C)  Weight: 190 lb 9.6 oz (86.5 kg)    Fetal Status: Fetal Heart Rate (bpm): 130 Fundal Height: 32 cm Movement: Present     General:  Alert, oriented and cooperative. Patient is in no acute distress.  Skin: Skin is warm and dry. No rash noted.   Cardiovascular: Normal heart rate noted  Respiratory: Normal respiratory effort, no problems with respiration noted  Abdomen: Soft, gravid, appropriate for gestational age.  Pain/Pressure: Absent     Pelvic: Cervical exam deferred        Extremities: Normal range of motion.  Edema: None  Mental Status: Normal mood and affect. Normal behavior. Normal judgment and thought content.   Assessment and Plan:  Pregnancy: [name] at [redacted]w[redacted]d 1. Supervision of other normal pregnancy, antepartum Not working Has crib, car seat is ordered  2. Overweight BMI=27.4 Gained 4 lbs in last 2 wks 37 lb 9.6 oz (17.1 kg) Walking 2x/wk x 20-30 min  3. Anxiety dx'd 2020 Has apt with Milton Ferguson 03/16/22   Preterm labor symptoms and general obstetric precautions  including but not limited to vaginal bleeding, contractions, leaking of fluid and fetal movement were reviewed in detail with the patient. Please refer to After Visit Summary for other counseling recommendations.  No follow-ups on file.  Future Appointments  Date Time Provider [place]  03/16/2022  1:00 PM Milton Ferguson, LCSW [facility] None    Herbie Saxon, CNM

## 2022-03-16 ENCOUNTER — Encounter: Payer: Self-pay | Admitting: Licensed Clinical Social Worker

## 2022-03-16 ENCOUNTER — Ambulatory Visit: Payer: Self-pay | Admitting: Licensed Clinical Social Worker

## 2022-03-16 DIAGNOSIS — F4323 Adjustment disorder with mixed anxiety and depressed mood: Secondary | ICD-10-CM

## 2022-03-16 HISTORY — DX: Adjustment disorder with mixed anxiety and depressed mood: F43.23
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
## 2022-03-23 ENCOUNTER — Ambulatory Visit: Payer: Self-pay | Admitting: Advanced Practice Midwife

## 2022-03-23 VITALS — BP 99/63 | HR 91 | Temp 97.4°F | Wt 191.0 lb

## 2022-03-23 DIAGNOSIS — F419 Anxiety disorder, unspecified: Secondary | ICD-10-CM

## 2022-03-23 DIAGNOSIS — Z3480 Encounter for supervision of other normal pregnancy, unspecified trimester: Secondary | ICD-10-CM

## 2022-03-23 DIAGNOSIS — E663 Overweight: Secondary | ICD-10-CM

## 2022-03-23 DIAGNOSIS — Z3483 Encounter for supervision of other normal pregnancy, third trimester: Secondary | ICD-10-CM

## 2022-03-23 NOTE — Progress Notes (Signed)
[facility]  PRENATAL VISIT NOTE  Subjective:  Kellie Lowery is a 26 y.o. [name] at [redacted]w[redacted]d being seen today for ongoing prenatal care.  She is currently monitored for the following issues for this low-risk pregnancy and has Hx of self mutilation--cutter age 33-15; Overweight BMI=27.4; Anxiety dx'd 2020; Supervision of other normal pregnancy, antepartum; and Adjustment disorder with mixed anxiety and depressed mood on their problem list.  Patient reports no complaints.  Contractions: Not present. Vag. Bleeding: None.  Movement: Present. Denies leaking of fluid/ROM.   The following portions of the patient's history were reviewed and updated as appropriate: allergies, current medications, past family history, past medical history, past social history, past surgical history and problem list. Problem list updated.  Objective:   Vitals:   03/23/22 1451  BP: 99/63  Pulse: 91  Temp: (!) 97.4 F (36.3 C)  Weight: 191 lb (86.6 kg)    Fetal Status: Fetal Heart Rate (bpm): 160 Fundal Height: 34 cm Movement: Present     General:  Alert, oriented and cooperative. Patient is in no acute distress.  Skin: Skin is warm and dry. No rash noted.   Cardiovascular: Normal heart rate noted  Respiratory: Normal respiratory effort, no problems with respiration noted  Abdomen: Soft, gravid, appropriate for gestational age.  Pain/Pressure: Absent     Pelvic: Cervical exam deferred        Extremities: Normal range of motion.  Edema: None  Mental Status: Normal mood and affect. Normal behavior. Normal judgment and thought content.   Assessment and Plan:  Pregnancy: [name] at [redacted]w[redacted]d  1. Supervision of other normal pregnancy, antepartum Not working Has crib and car seat   2. Overweight BMI=27.4 Not taking ASA 81 mg daily 38 lb (17.2 kg) Walking 4x/wk x 20-30 min  3. Anxiety dx'd 2020 Had apts with Milton Ferguson, LCSW 03/16/22 and  03/30/22   Preterm labor symptoms and general obstetric precautions including but not limited to vaginal bleeding, contractions, leaking of fluid and fetal movement were reviewed in detail with the patient. Please refer to After Visit Summary for other counseling recommendations.  Return in about 2 weeks (around 04/06/2022) for routine PNC.  Future Appointments  Date Time Provider Vera  03/30/2022  2:00 PM Milton Ferguson, LCSW [facility] None    Herbie Saxon, CNM

## 2022-03-29 ENCOUNTER — Emergency Department: Payer: No Typology Code available for payment source

## 2022-03-29 ENCOUNTER — Emergency Department
Admission: EM | Admit: 2022-03-29 | Discharge: 2022-03-29 | Disposition: A | Discharge status: Home / Self Care | Payer: No Typology Code available for payment source | Attending: Emergency Medicine | Admitting: Emergency Medicine

## 2022-03-29 ENCOUNTER — Other Ambulatory Visit: Payer: Self-pay

## 2022-03-29 ENCOUNTER — Observation Stay
Admission: EM | Admit: 2022-03-29 | Discharge: 2022-03-30 | Disposition: A | Discharge status: Home / Self Care | Payer: Self-pay | Attending: Obstetrics | Admitting: Obstetrics

## 2022-03-29 DIAGNOSIS — O9A213 Injury, poisoning and certain other consequences of external causes complicating pregnancy, third trimester: Principal | ICD-10-CM | POA: Insufficient documentation

## 2022-03-29 DIAGNOSIS — Y92410 Unspecified street and highway as the place of occurrence of the external cause: Secondary | ICD-10-CM | POA: Diagnosis not present

## 2022-03-29 DIAGNOSIS — M545 Low back pain, unspecified: Secondary | ICD-10-CM | POA: Insufficient documentation

## 2022-03-29 DIAGNOSIS — Z3A35 35 weeks gestation of pregnancy: Secondary | ICD-10-CM | POA: Insufficient documentation

## 2022-03-29 DIAGNOSIS — O26893 Other specified pregnancy related conditions, third trimester: Secondary | ICD-10-CM | POA: Insufficient documentation

## 2022-03-29 DIAGNOSIS — Z3A32 32 weeks gestation of pregnancy: Secondary | ICD-10-CM | POA: Insufficient documentation

## 2022-03-29 DIAGNOSIS — M542 Cervicalgia: Secondary | ICD-10-CM | POA: Insufficient documentation

## 2022-03-29 MED ORDER — CYCLOBENZAPRINE HCL 5 MG PO TABS
5.0000 mg | ORAL_TABLET | Freq: Three times a day (TID) | ORAL | Status: DC | PRN
  Administered 2022-03-30: 5 mg via ORAL
  Filled 2022-03-29 (×2): qty 1

## 2022-03-29 MED ORDER — ACETAMINOPHEN 325 MG PO TABS
650.0000 mg | ORAL_TABLET | ORAL | Status: DC | PRN
  Administered 2022-03-29: 650 mg via ORAL
  Filled 2022-03-29: qty 2

## 2022-03-29 MED ORDER — LACTATED RINGERS IV SOLN: INTRAVENOUS | Status: DC
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
## 2022-03-29 NOTE — Discharge Instructions (Signed)
Please proceed directly to labor and delivery for further monitoring.  Return to the emergency department for any significant abdominal pain, any vaginal leakage or bleeding or any other symptom personally concerning to yourself.
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
## 2022-03-29 NOTE — ED Notes (Signed)
Pt Dc from ED, with instructions to present to L&D. L&D called and pt is expected. To be placed in Obs 2. ED dc instructions reviewed with pt with all questions answered. Pt verbalizes understanding. Pt transported to obs 2 via wheelchair with EDT.
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
## 2022-03-30 ENCOUNTER — Ambulatory Visit: Payer: Self-pay | Admitting: Licensed Clinical Social Worker

## 2022-03-30 MED ORDER — OXYCODONE-ACETAMINOPHEN 5-325 MG PO TABS: 1.0000 | ORAL_TABLET | Freq: Four times a day (QID) | ORAL | 0 refills | Status: AC | PRN

## 2022-03-30 MED ORDER — CYCLOBENZAPRINE HCL 5 MG PO TABS: 5.0000 mg | ORAL_TABLET | Freq: Three times a day (TID) | ORAL | 0 refills | Status: DC | PRN
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
## 2022-04-04 ENCOUNTER — Ambulatory Visit: Payer: Self-pay

## 2022-04-04 ENCOUNTER — Telehealth: Payer: Self-pay

## 2022-04-04 NOTE — Telephone Encounter (Signed)
TC to patient to reschedule missed appointment. Patient is rescheduled for 04/05/22.Marland KitchenJenetta Downer, RN

## 2022-04-05 ENCOUNTER — Ambulatory Visit: Payer: Self-pay | Admitting: Advanced Practice Midwife

## 2022-04-05 VITALS — BP 109/72 | Wt 193.0 lb

## 2022-04-05 DIAGNOSIS — Z3483 Encounter for supervision of other normal pregnancy, third trimester: Secondary | ICD-10-CM

## 2022-04-05 DIAGNOSIS — E663 Overweight: Secondary | ICD-10-CM

## 2022-04-05 DIAGNOSIS — Z3480 Encounter for supervision of other normal pregnancy, unspecified trimester: Secondary | ICD-10-CM

## 2022-04-05 DIAGNOSIS — F4323 Adjustment disorder with mixed anxiety and depressed mood: Secondary | ICD-10-CM
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
## 2022-04-06 ENCOUNTER — Ambulatory Visit: Payer: Self-pay | Admitting: Licensed Clinical Social Worker

## 2022-04-06 ENCOUNTER — Telehealth: Payer: Self-pay

## 2022-04-06 NOTE — Progress Notes (Unsigned)
Counselor/Therapist Progress Note  Patient ID: Kellie Lowery, MRN: [id number],    Date: 04/06/2022  Time Spent: ***   Treatment Type: Psychotherapy  Reported Symptoms: {CHL AMB Reported [phone]}  Mental Status Exam:  Appearance:   {PSY:22683}     Behavior:  {PSY:21022743}  Motor:  {PSY:22302}  Speech/Language:   {PSY:22685}  Affect:  {PSY:22687}  Mood:  {PSY:31886}  Thought process:  {PSY:31888}  Thought content:    {PSY:[phone]}  Sensory/Perceptual disturbances:    {[phone]}  Orientation:  {PSY:30297}  Attention:  {PSY:22877}  Concentration:  {[phone]}  Memory:  {PSY:3173572875}  Fund of knowledge:   {[phone]}  Insight:    {[phone]}  Judgment:   {[phone]}  Impulse Control:  {[phone]}   Risk Assessment: Danger to Self:  No Self-injurious Behavior: No Danger to Others: No Duty to Warn:no Physical Aggression / Violence:No  Access to Firearms a concern: No  Gang Involvement:No   Subjective: Patient was engaged and cooperative throughout the session using time effectively to discuss   Patient voices continued motivation for treatment and understanding of  . Patient is likely to benefit from future treatment because  remains motivated to decrease  And   and reports benefit of regular sessions in addressing these symptoms.     Interventions: {PSY:[phone]}  Checked in with patient and reviewed previous session, including assessment and goal of treatment. Reviewed CBTs. Explored patient's goal of treatment and worked collaboratively to develop CBT treatment plan. Provided support through active listening, validation of feelings, and highlighted patient's strengths.    Diagnosis:No diagnosis found.  Plan: Patient's goal of treatment is     Future Appointments  Date Time Provider [place]  04/06/2022  2:00 PM Milton Ferguson, LCSW [facility] None      Milton Ferguson, [place]

## 2022-04-06 NOTE — Telephone Encounter (Signed)
TC to patient to schedule next MH RV. Patient overbooked due to no available appointments. Jenetta Downer, RN

## 2022-04-09 LAB — CULTURE, BETA STREP (GROUP B ONLY): Strep Gp B Culture: NEGATIVE

## 2022-04-10 LAB — CHLAMYDIA/GC NAA, CONFIRMATION
Chlamydia trachomatis, NAA: NEGATIVE
Neisseria gonorrhoeae, NAA: NEGATIVE

## 2022-04-12 ENCOUNTER — Ambulatory Visit: Payer: Self-pay | Admitting: Advanced Practice Midwife

## 2022-04-12 VITALS — BP 113/66 | HR 82 | Temp 97.3°F | Wt 193.0 lb

## 2022-04-12 DIAGNOSIS — F419 Anxiety disorder, unspecified: Secondary | ICD-10-CM

## 2022-04-12 DIAGNOSIS — Z3483 Encounter for supervision of other normal pregnancy, third trimester: Secondary | ICD-10-CM

## 2022-04-12 DIAGNOSIS — Z3480 Encounter for supervision of other normal pregnancy, unspecified trimester: Secondary | ICD-10-CM

## 2022-04-12 DIAGNOSIS — E663 Overweight: Secondary | ICD-10-CM

## 2022-04-12 DIAGNOSIS — F4323 Adjustment disorder with mixed anxiety and depressed mood: Secondary | ICD-10-CM

## 2022-04-12 MED ORDER — PRENATAL MULTIVITAMIN CH: 1.0000 | ORAL_TABLET | Freq: Every day | ORAL | 0 refills | Status: AC

## 2022-04-12 NOTE — Progress Notes (Signed)
[place] [facility]  PRENATAL VISIT NOTE  Subjective:  Kellie Lowery is a 26 y.o. [name] at [redacted]w[redacted]d being seen today for ongoing prenatal care.  She is currently monitored for the following issues for this low-risk pregnancy and has Hx of self mutilation--cutter age 73-15; Overweight BMI=27.4; Anxiety dx'd 2020; Supervision of other normal pregnancy, antepartum; and Adjustment disorder with mixed anxiety and depressed mood on their problem list.  Patient reports no complaints.  Contractions: Not present. Vag. Bleeding: None.  Movement: Present. Denies leaking of fluid/ROM.   The following portions of the patient's history were reviewed and updated as appropriate: allergies, current medications, past family history, past medical history, past social history, past surgical history and problem list. Problem list updated.  Objective:   Vitals:   04/12/22 0831  BP: 113/66  Pulse: 82  Temp: (!) 97.3 F (36.3 C)  Weight: 193 lb (87.5 kg)    Fetal Status: Fetal Heart Rate (bpm): 130 Fundal Height: 36 cm Movement: Present  Presentation: Vertex  General:  Alert, oriented and cooperative. Patient is in no acute distress.  Skin: Skin is warm and dry. No rash noted.   Cardiovascular: Normal heart rate noted  Respiratory: Normal respiratory effort, no problems with respiration noted  Abdomen: Soft, gravid, appropriate for gestational age.  Pain/Pressure: Absent     Pelvic: Cervical exam deferred        Extremities: Normal range of motion.  Edema: None  Mental Status: Normal mood and affect. Normal behavior. Normal judgment and thought content.   Assessment and Plan:  Pregnancy: [name] at [redacted]w[redacted]d  1. Supervision of other normal pregnancy, antepartum 40 lb (18.1 kg) Not working Has car seat and crib Knows when to go to L&D GC/Chlamydia/GBS cultures neg on 04/05/22 Walking 3x/wk x 20 min  - Prenatal Vit-Fe Fumarate-FA (PRENATAL  MULTIVITAMIN) TABS tablet; Take 1 tablet by mouth daily at 12 noon.  Dispense: 100 tablet; Refill: 0  2. Overweight BMI=27.4 40 lb (18.1 kg)   3. Adjustment disorder with mixed anxiety and depressed mood Had apt with Milton Ferguson, LCSW 03/16/22, [facility] 03/30/22 and 04/06/22  - Prenatal Vit-Fe Fumarate-FA (PRENATAL MULTIVITAMIN) TABS tablet; Take 1 tablet by mouth daily at 12 noon.  Dispense: 100 tablet; Refill: 0   Preterm labor symptoms and general obstetric precautions including but not limited to vaginal bleeding, contractions, leaking of fluid and fetal movement were reviewed in detail with the patient. Please refer to After Visit Summary for other counseling recommendations.  No follow-ups on file.  Future Appointments  Date Time Provider [place]  04/19/2022  8:20 AM [facility] PROVIDER [facility] None    Herbie Saxon, CNM

## 2022-04-12 NOTE — Progress Notes (Signed)
Patient here for MH RV [redacted]w[redacted]d. Per patient, 36 week packet given last visit.   Harland Dingwall, RN

## 2022-04-19 ENCOUNTER — Ambulatory Visit: Payer: Self-pay | Admitting: Advanced Practice Midwife

## 2022-04-19 VITALS — BP 110/68 | HR 98 | Temp 97.6°F | Wt 195.4 lb

## 2022-04-19 DIAGNOSIS — F419 Anxiety disorder, unspecified: Secondary | ICD-10-CM

## 2022-04-19 DIAGNOSIS — E663 Overweight: Secondary | ICD-10-CM

## 2022-04-19 DIAGNOSIS — Z3480 Encounter for supervision of other normal pregnancy, unspecified trimester: Secondary | ICD-10-CM

## 2022-04-19 DIAGNOSIS — Z3483 Encounter for supervision of other normal pregnancy, third trimester: Secondary | ICD-10-CM

## 2022-04-19 NOTE — Progress Notes (Signed)
[place] [facility]  PRENATAL VISIT NOTE  Subjective:  Kellie Lowery is a 26 y.o. [name] at [redacted]w[redacted]d being seen today for ongoing prenatal care.  She is currently monitored for the following issues for this low-risk pregnancy and has Hx of self mutilation--cutter age 24-15; Overweight BMI=27.4; Anxiety dx'd 2020; Supervision of other normal pregnancy, antepartum; and Adjustment disorder with mixed anxiety and depressed mood on their problem list.  Patient reports no complaints.  Contractions: Not present. Vag. Bleeding: None.  Movement: Present. Denies leaking of fluid/ROM.   The following portions of the patient's history were reviewed and updated as appropriate: allergies, current medications, past family history, past medical history, past social history, past surgical history and problem list. Problem list updated.  Objective:   Vitals:   04/19/22 0834  BP: 110/68  Pulse: 98  Temp: 97.6 F (36.4 C)  Weight: 195 lb 6.4 oz (88.6 kg)    Fetal Status: Fetal Heart Rate (bpm): 150 Fundal Height: 38 cm Movement: Present  Presentation: Vertex  General:  Alert, oriented and cooperative. Patient is in no acute distress.  Skin: Skin is warm and dry. No rash noted.   Cardiovascular: Normal heart rate noted  Respiratory: Normal respiratory effort, no problems with respiration noted  Abdomen: Soft, gravid, appropriate for gestational age.  Pain/Pressure: Absent     Pelvic: Cervical exam deferred        Extremities: Normal range of motion.  Edema: Trace  Mental Status: Normal mood and affect. Normal behavior. Normal judgment and thought content.   Assessment and Plan:  Pregnancy: [name] at [redacted]w[redacted]d  1. Supervision of other normal pregnancy, antepartum Here with FOB Not working Considering Nexplanon pp Has car seat, crib Walking 3x/wk x 20 min Knows when to go to L&D  2. Overweight BMI=27.4 42 lb 6.4 oz (19.2 kg) Gained 2 lbs in last  week  3. Anxiety dx'd 2020 Hasn't rescheduled missed apt with Milton Ferguson yet for counseling   Term labor symptoms and general obstetric precautions including but not limited to vaginal bleeding, contractions, leaking of fluid and fetal movement were reviewed in detail with the patient. Please refer to After Visit Summary for other counseling recommendations.  Return in about 1 week (around 04/26/2022) for routine PNC.  No future appointments.  Herbie Saxon, CNM

## 2022-04-27 ENCOUNTER — Inpatient Hospital Stay: Payer: Medicaid Other | Admitting: Anesthesiology

## 2022-04-27 ENCOUNTER — Ambulatory Visit: Payer: Self-pay | Admitting: Advanced Practice Midwife

## 2022-04-27 ENCOUNTER — Inpatient Hospital Stay
Admission: EM | Admit: 2022-04-27 | Discharge: 2022-04-28 | DRG: 807 | Disposition: A | Discharge status: Home / Self Care | Payer: Medicaid Other | Attending: Certified Nurse Midwife | Admitting: Certified Nurse Midwife

## 2022-04-27 ENCOUNTER — Encounter: Payer: Self-pay | Admitting: Certified Nurse Midwife

## 2022-04-27 ENCOUNTER — Other Ambulatory Visit: Payer: Self-pay | Admitting: Certified Nurse Midwife

## 2022-04-27 ENCOUNTER — Other Ambulatory Visit: Payer: Self-pay

## 2022-04-27 VITALS — BP 122/72 | HR 88 | Temp 97.0°F | Wt 194.6 lb

## 2022-04-27 DIAGNOSIS — Z3480 Encounter for supervision of other normal pregnancy, unspecified trimester: Secondary | ICD-10-CM

## 2022-04-27 DIAGNOSIS — F419 Anxiety disorder, unspecified: Secondary | ICD-10-CM

## 2022-04-27 DIAGNOSIS — Z3483 Encounter for supervision of other normal pregnancy, third trimester: Secondary | ICD-10-CM

## 2022-04-27 DIAGNOSIS — Z3A39 39 weeks gestation of pregnancy: Secondary | ICD-10-CM | POA: Diagnosis not present

## 2022-04-27 DIAGNOSIS — O26893 Other specified pregnancy related conditions, third trimester: Secondary | ICD-10-CM | POA: Diagnosis present

## 2022-04-27 DIAGNOSIS — Z23 Encounter for immunization: Secondary | ICD-10-CM

## 2022-04-27 DIAGNOSIS — E663 Overweight: Secondary | ICD-10-CM

## 2022-04-27 LAB — CBC
HCT: 36.1 % (ref 36.0–46.0)
Hemoglobin: 12.3 g/dL (ref 12.0–15.0)
MCH: 29.4 pg (ref 26.0–34.0)
MCHC: 34.1 g/dL (ref 30.0–36.0)
MCV: 86.2 fL (ref 80.0–100.0)
Platelets: 222 10*3/uL (ref 150–400)
RBC: 4.19 MIL/uL (ref 3.87–5.11)
RDW: 13.5 % (ref 11.5–15.5)
WBC: 13.8 10*3/uL — ABNORMAL HIGH (ref 4.0–10.5)
nRBC: 0 % (ref 0.0–0.2)

## 2022-04-27 LAB — TYPE AND SCREEN
ABO/RH(D): A POS
Antibody Screen: NEGATIVE

## 2022-04-27 MED ORDER — WITCH HAZEL-GLYCERIN EX PADS
1.0000 | MEDICATED_PAD | CUTANEOUS | Status: DC | PRN
  Administered 2022-04-27: 1 via TOPICAL
  Filled 2022-04-27: qty 100

## 2022-04-27 MED ORDER — PHENYLEPHRINE 80 MCG/ML (10ML) SYRINGE FOR IV PUSH (FOR BLOOD PRESSURE SUPPORT): 80.0000 ug | PREFILLED_SYRINGE | INTRAVENOUS | Status: DC | PRN

## 2022-04-27 MED ORDER — AMMONIA AROMATIC IN INHA
RESPIRATORY_TRACT | Status: AC
  Filled 2022-04-27: qty 10

## 2022-04-27 MED ORDER — METHYLERGONOVINE MALEATE 0.2 MG/ML IJ SOLN: 0.2000 mg | INTRAMUSCULAR | Status: DC | PRN

## 2022-04-27 MED ORDER — LIDOCAINE HCL (PF) 1 % IJ SOLN
INTRAMUSCULAR | Status: DC | PRN
  Administered 2022-04-27: 3 mL

## 2022-04-27 MED ORDER — LACTATED RINGERS IV SOLN
500.0000 mL | Freq: Once | INTRAVENOUS | Status: AC
  Administered 2022-04-27: 500 mL via INTRAVENOUS

## 2022-04-27 MED ORDER — LACTATED RINGERS IV SOLN
INTRAVENOUS | Status: DC
  Administered 2022-04-27: 1000 mL via INTRAVENOUS

## 2022-04-27 MED ORDER — BUPIVACAINE HCL (PF) 0.25 % IJ SOLN
INTRAMUSCULAR | Status: DC | PRN
  Administered 2022-04-27 (×2): 4 mL via EPIDURAL

## 2022-04-27 MED ORDER — LACTATED RINGERS IV SOLN: 500.0000 mL | INTRAVENOUS | Status: DC | PRN

## 2022-04-27 MED ORDER — TERBUTALINE SULFATE 1 MG/ML IJ SOLN: 0.2500 mg | Freq: Once | INTRAMUSCULAR | Status: DC | PRN

## 2022-04-27 MED ORDER — SIMETHICONE 80 MG PO CHEW: 80.0000 mg | CHEWABLE_TABLET | ORAL | Status: DC | PRN

## 2022-04-27 MED ORDER — BENZOCAINE-MENTHOL 20-0.5 % EX AERO
1.0000 | INHALATION_SPRAY | CUTANEOUS | Status: DC | PRN
  Administered 2022-04-27: 1 via TOPICAL
  Filled 2022-04-27: qty 56

## 2022-04-27 MED ORDER — LIDOCAINE-EPINEPHRINE (PF) 1.5 %-1:200000 IJ SOLN
INTRAMUSCULAR | Status: DC | PRN
  Administered 2022-04-27: 3 mL via EPIDURAL

## 2022-04-27 MED ORDER — PRENATAL MULTIVITAMIN CH
1.0000 | ORAL_TABLET | Freq: Every day | ORAL | Status: DC
  Administered 2022-04-28: 1 via ORAL
  Filled 2022-04-27: qty 1

## 2022-04-27 MED ORDER — METHYLERGONOVINE MALEATE 0.2 MG PO TABS: 0.2000 mg | ORAL_TABLET | ORAL | Status: DC | PRN

## 2022-04-27 MED ORDER — MISOPROSTOL 200 MCG PO TABS
ORAL_TABLET | ORAL | Status: AC
  Filled 2022-04-27: qty 4

## 2022-04-27 MED ORDER — FERROUS SULFATE 325 (65 FE) MG PO TABS
325.0000 mg | ORAL_TABLET | Freq: Every day | ORAL | Status: DC
  Administered 2022-04-28: 325 mg via ORAL
  Filled 2022-04-27: qty 1

## 2022-04-27 MED ORDER — LIDOCAINE HCL (PF) 1 % IJ SOLN
INTRAMUSCULAR | Status: AC
  Filled 2022-04-27: qty 30

## 2022-04-27 MED ORDER — OXYTOCIN-SODIUM CHLORIDE 30-0.9 UT/500ML-% IV SOLN
1.0000 m[IU]/min | INTRAVENOUS | Status: DC
  Administered 2022-04-27: 4 m[IU]/min via INTRAVENOUS

## 2022-04-27 MED ORDER — ONDANSETRON HCL 4 MG/2ML IJ SOLN: 4.0000 mg | INTRAMUSCULAR | Status: DC | PRN

## 2022-04-27 MED ORDER — OXYTOCIN-SODIUM CHLORIDE 30-0.9 UT/500ML-% IV SOLN
INTRAVENOUS | Status: AC
  Filled 2022-04-27: qty 500

## 2022-04-27 MED ORDER — COCONUT OIL OIL: 1.0000 | TOPICAL_OIL | Status: DC | PRN

## 2022-04-27 MED ORDER — SOD CITRATE-CITRIC ACID 500-334 MG/5ML PO SOLN: 30.0000 mL | ORAL | Status: DC | PRN

## 2022-04-27 MED ORDER — ONDANSETRON HCL 4 MG/2ML IJ SOLN: 4.0000 mg | Freq: Four times a day (QID) | INTRAMUSCULAR | Status: DC | PRN

## 2022-04-27 MED ORDER — OXYTOCIN 10 UNIT/ML IJ SOLN
INTRAMUSCULAR | Status: AC
  Filled 2022-04-27: qty 2

## 2022-04-27 MED ORDER — FENTANYL-BUPIVACAINE-NACL 0.5-0.125-0.9 MG/250ML-% EP SOLN
12.0000 mL/h | EPIDURAL | Status: DC | PRN
  Administered 2022-04-27: 12 mL/h via EPIDURAL
  Filled 2022-04-27: qty 250

## 2022-04-27 MED ORDER — DIPHENHYDRAMINE HCL 50 MG/ML IJ SOLN: 12.5000 mg | INTRAMUSCULAR | Status: DC | PRN

## 2022-04-27 MED ORDER — OXYCODONE-ACETAMINOPHEN 5-325 MG PO TABS: 1.0000 | ORAL_TABLET | ORAL | Status: DC | PRN

## 2022-04-27 MED ORDER — DOCUSATE SODIUM 100 MG PO CAPS
100.0000 mg | ORAL_CAPSULE | Freq: Two times a day (BID) | ORAL | Status: DC
  Administered 2022-04-28: 100 mg via ORAL
  Filled 2022-04-27: qty 1

## 2022-04-27 MED ORDER — ONDANSETRON HCL 4 MG PO TABS: 4.0000 mg | ORAL_TABLET | ORAL | Status: DC | PRN

## 2022-04-27 MED ORDER — EPHEDRINE 5 MG/ML INJ: 10.0000 mg | INTRAVENOUS | Status: DC | PRN

## 2022-04-27 MED ORDER — OXYTOCIN-SODIUM CHLORIDE 30-0.9 UT/500ML-% IV SOLN
2.5000 [IU]/h | INTRAVENOUS | Status: DC
  Administered 2022-04-27: 2.5 [IU]/h via INTRAVENOUS
  Filled 2022-04-27: qty 500

## 2022-04-27 MED ORDER — IBUPROFEN 600 MG PO TABS
600.0000 mg | ORAL_TABLET | Freq: Four times a day (QID) | ORAL | Status: DC
  Administered 2022-04-27 – 2022-04-28 (×4): 600 mg via ORAL
  Filled 2022-04-27 (×4): qty 1

## 2022-04-27 MED ORDER — OXYTOCIN BOLUS FROM INFUSION
333.0000 mL | Freq: Once | INTRAVENOUS | Status: AC
  Administered 2022-04-27: 333 mL via INTRAVENOUS

## 2022-04-27 MED ORDER — FENTANYL CITRATE (PF) 100 MCG/2ML IJ SOLN: 50.0000 ug | INTRAMUSCULAR | Status: DC | PRN

## 2022-04-27 MED ORDER — DIBUCAINE (PERIANAL) 1 % EX OINT: 1.0000 | TOPICAL_OINTMENT | CUTANEOUS | Status: DC | PRN

## 2022-04-27 MED ORDER — OXYCODONE-ACETAMINOPHEN 5-325 MG PO TABS: 2.0000 | ORAL_TABLET | ORAL | Status: DC | PRN

## 2022-04-27 MED ORDER — LIDOCAINE HCL (PF) 1 % IJ SOLN: 30.0000 mL | INTRAMUSCULAR | Status: DC | PRN

## 2022-04-27 MED ORDER — ACETAMINOPHEN 325 MG PO TABS
650.0000 mg | ORAL_TABLET | ORAL | Status: DC | PRN
  Administered 2022-04-27: 650 mg via ORAL
  Filled 2022-04-27: qty 2

## 2022-04-27 MED ORDER — SENNOSIDES-DOCUSATE SODIUM 8.6-50 MG PO TABS
2.0000 | ORAL_TABLET | ORAL | Status: DC
  Administered 2022-04-27: 2 via ORAL
  Filled 2022-04-27: qty 2

## 2022-04-27 NOTE — Progress Notes (Signed)
LABOR NOTE   Kellie Lowery 26 y.o.GP@ at [redacted]w[redacted]d  SUBJECTIVE:  Comfortable with epidural  Analgesia: Epidural  OBJECTIVE:  BP (!) 98/55   Pulse 75   Temp 97.6 F (36.4 C) (Oral)   Resp 20   Ht 5\' 4"  (1.626 m)   Wt 88.3 kg   LMP 07/09/2021 (Exact Date) Comment: EDD calculated according to LMP  SpO2 98%   BMI 33.40 kg/m  Total I/O In: -  Out: 250 [Urine:250]  She has shown cervical change. CERVIX: per RN exam  SVE:   Dilation: Lip/rim Effacement (%): 90 Station: 0 Exam by:: A Franks RN CONTRACTIONS: regular, every 2-3 minutes FHR: Fetal heart tracing reviewed. Baseline: 140 bpm, Variability: Good {> 6 bpm), Accelerations: Reactive, and Decelerations: Absent Category I    Labs: Lab Results  Component Value Date   WBC 13.8 (H) 04/27/2022   HGB 12.3 04/27/2022   HCT 36.1 04/27/2022   MCV 86.2 04/27/2022   PLT 222 04/27/2022    ASSESSMENT: 1) Labor curve reviewed.       Progress: Active phase labor.     Membranes: ruptured            Active Problems:   Labor and delivery, indication for care   PLAN: continue present management   04/29/2022, CNM  04/27/2022 4:47 PM

## 2022-04-27 NOTE — Progress Notes (Signed)
[facility]  PRENATAL VISIT NOTE  Subjective:  Kellie Lowery is a 26 y.o. [name] at [redacted]w[redacted]d being seen today for ongoing prenatal care.  She is currently monitored for the following issues for this low-risk pregnancy and has Hx of self mutilation--cutter age 51-15; Overweight BMI=27.4; Anxiety dx'd 2020; Supervision of other normal pregnancy, antepartum; and Adjustment disorder with mixed anxiety and depressed mood on their problem list.  Patient reports backache and contractions since 0300 .  Contractions: Regular. Vag. Bleeding: None.  Movement: Present. Denies leaking of fluid/ROM.   The following portions of the patient's history were reviewed and updated as appropriate: allergies, current medications, past family history, past medical history, past social history, past surgical history and problem list. Problem list updated.  Objective:   Vitals:   04/27/22 0847  BP: 122/72  Pulse: 88  Temp: (!) 97 F (36.1 C)  Weight: 194 lb 9.6 oz (88.3 kg)    Fetal Status: Fetal Heart Rate (bpm): 150 Fundal Height: 37 cm Movement: Present  Presentation: Vertex  General:  Alert, oriented and cooperative. Patient is in no acute distress.  Skin: Skin is warm and dry. No rash noted.   Cardiovascular: Normal heart rate noted  Respiratory: Normal respiratory effort, no problems with respiration noted  Abdomen: Soft, gravid, appropriate for gestational age.  Pain/Pressure: Present     Pelvic: Cervical exam performed Dilation: 4.5 Effacement (%): 90 Station: -2  Extremities: Normal range of motion.  Edema: Trace  Mental Status: Normal mood and affect. Normal behavior. Normal judgment and thought content.   Assessment and Plan:  Pregnancy: [name] at [redacted]w[redacted]d  1. Supervision of other normal pregnancy, antepartum C/o u/c's q 4-5 min x 60 sec since 0300 V/e midposition, soft, 90%, 4.5, bulging bag, vtx To L&D  Has car seat and crib Not  working Here with FOB   2. Overweight BMI=27.4 41 lb 9.6 oz (18.9 kg)   3. Anxiety dx'd 2020 Hasn't made apt with [name], LCSW yet  Term labor symptoms and general obstetric precautions including but not limited to vaginal bleeding, contractions, leaking of fluid and fetal movement were reviewed in detail with the patient. Please refer to After Visit Summary for other counseling recommendations.  No follow-ups on file.  No future appointments.  Alberteen Spindle, CNM

## 2022-04-27 NOTE — Anesthesia Preprocedure Evaluation (Signed)
Anesthesia Evaluation  Patient identified by MRN, date of birth, ID band Patient awake    Reviewed: Allergy & Precautions, H&P , NPO status , Patient's Chart, lab work & pertinent test results  Airway Mallampati: II  TM Distance: >3 FB Neck ROM: full    Dental no notable dental hx.    Pulmonary former smoker   Pulmonary exam normal        Cardiovascular negative cardio ROS Normal cardiovascular exam     Neuro/Psych  PSYCHIATRIC DISORDERS Anxiety     negative neurological ROS     GI/Hepatic Neg liver ROS,GERD  ,,  Endo/Other  negative endocrine ROS    Renal/GU negative Renal ROS  negative genitourinary   Musculoskeletal   Abdominal   Peds  Hematology negative hematology ROS (+)   Anesthesia Other Findings   Reproductive/Obstetrics (+) Pregnancy                             Anesthesia Physical Anesthesia Plan  ASA: 2  Anesthesia Plan: Epidural   Post-op Pain Management:    Induction:   PONV Risk Score and Plan:   Airway Management Planned:   Additional Equipment:   Intra-op Plan:   Post-operative Plan:   Informed Consent: I have reviewed the patients History and Physical, chart, labs and discussed the procedure including the risks, benefits and alternatives for the proposed anesthesia with the patient or authorized representative who has indicated his/her understanding and acceptance.       Plan Discussed with: Anesthesiologist and CRNA  Anesthesia Plan Comments:        Anesthesia Quick Evaluation

## 2022-04-27 NOTE — Progress Notes (Signed)
IOL form faxed to [facility] with okay confirmation.   Earlyne Iba, RN

## 2022-04-27 NOTE — H&P (Signed)
History and Physical   HPI  Kellie Lowery is a 26 y.o. [name] at [redacted]w[redacted]d Estimated Date of Delivery: 05/01/22 who is being admitted for labor management   OB History  OB History  Gravida Para Term Preterm AB Living  2 0 0 0 1 0  SAB IAB Ectopic Multiple Live Births  1 0 0 0 0    # Outcome Date GA Lbr Len/2nd Weight Sex Delivery Anes PTL Lv  2 Current           1 SAB 09/2020 [redacted]w[redacted]d           Obstetric Comments  Pt not really sure about abortion.  Thought had miscarriage and did not know it ????  ? Date  Henriette Combs RN    Per client at [facility] IP appt, has monthly menses that typically last 4 days with moderate bleeding / cramps. Client states missed menses in February and March last year, then had 7 days of heavy than usually vaginal bleeding with some cramps and clots in April 2022. Unsure if SAB, did not seek medical evaluation. Normal menses resumed in May 2022. Veatrice Kells RN    PROBLEM LIST  Pregnancy complications or risks: Patient Active Problem List   Diagnosis Date Noted   Labor and delivery, indication for care 04/27/2022   Adjustment disorder with mixed anxiety and depressed mood 03/16/2022   Supervision of other normal pregnancy, antepartum 09/27/2021   Overweight BMI=27.4 08/12/2020   Anxiety dx'd 2020 08/12/2020   Hx of self mutilation--cutter age 78-15 07/12/2019    Prenatal labs and studies: ABO, Rh: --/--/PENDING (11/15 1216) Antibody: PENDING (11/15 1216) Rubella:  pending  RPR: Non Reactive (08/22 1422)  HBsAg: Negative (04/17 1530)  HIV:   Negative OXB:DZHGDJME/-- (10/24 1430)   Past Medical History:  Diagnosis Date   Adjustment disorder with mixed anxiety and depressed mood 03/16/2022   Anxiety 2020   Breast pain    at pregnancy test visit said no breast pain now   History of anemia    as child     Past Surgical History:  Procedure Laterality Date   Denies surgical hx       Medications    Current  Discharge Medication List     CONTINUE these medications which have NOT CHANGED   Details  Prenatal Vit-Fe Fumarate-FA (PRENATAL MULTIVITAMIN) TABS tablet Take 1 tablet by mouth daily at 12 noon. Qty: 100 tablet, Refills: 0   Associated Diagnoses: Supervision of other normal pregnancy, antepartum    calcium carbonate (TUMS - DOSED IN MG ELEMENTAL CALCIUM) 500 MG chewable tablet Chew 1 tablet by mouth daily. "Sometimes"    cyclobenzaprine (FLEXERIL) 5 MG tablet Take 1 tablet (5 mg total) by mouth 3 (three) times daily as needed for muscle spasms. Qty: 6 tablet, Refills: 0         Allergies  Cinnamon  Review of Systems  Constitutional: negative Eyes: negative Ears, nose, mouth, throat, and face: negative Respiratory: negative Cardiovascular: negative Gastrointestinal: negative Genitourinary:negative Integument/breast: negative Hematologic/lymphatic: negative Musculoskeletal:negative Neurological: negative Behavioral/Psych: negative Endocrine: negative  Physical Exam  BP 106/61   Pulse 87   Temp 98.5 F (36.9 C) (Oral)   Resp 20   Ht 5\' 4"  (1.626 m)   Wt 88.3 kg   LMP 07/09/2021 (Exact Date) Comment: EDD calculated according to LMP  SpO2 97%   BMI 33.40 kg/m   Lungs:  CTA B Cardio: RRR without M/R/G Abd: Soft, gravid,  NT Presentation: cephalic EXT: No C/C/ 1+ Edema DTRs: 2+ B CERVIX: Dilation: 6 Effacement (%): 90 Station: -2 Presentation: Vertex Exam by:: A Janee Morn CNM  See Prenatal records for more detailed PE.     FHR:  Baseline: 125 bpm, Variability: Good {> 6 bpm), Accelerations: Reactive, and Decelerations: Absent  Toco: Uterine Contractions: Frequency: Every 2-3 minutes and Duration:  50-70 seconds  Test Results  Results for orders placed or performed during the hospital encounter of 04/27/22 (from the past 24 hour(s))  CBC     Status: Abnormal   Collection Time: 04/27/22 12:16 PM  Result Value Ref Range   WBC 13.8 (H) 4.0 - 10.5  K/uL   RBC 4.19 3.87 - 5.11 MIL/uL   Hemoglobin 12.3 12.0 - 15.0 g/dL   HCT 84.6 96.2 - 95.2 %   MCV 86.2 80.0 - 100.0 fL   MCH 29.4 26.0 - 34.0 pg   MCHC 34.1 30.0 - 36.0 g/dL   RDW 84.1 32.4 - 40.1 %   Platelets 222 150 - 400 K/uL   nRBC 0.0 0.0 - 0.2 %  Type and screen [facility]     Status: None (Preliminary result)   Collection Time: 04/27/22 12:16 PM  Result Value Ref Range   ABO/RH(D) PENDING    Antibody Screen PENDING    Sample Expiration      04/30/2022,2359 Performed at [facility], [address] [place], [place] [address]    Group B Strep negative  Assessment   [name] at [redacted]w[redacted]d Estimated Date of Delivery: 05/01/22  The fetus is reassuring.   Patient Active Problem List   Diagnosis Date Noted   Labor and delivery, indication for care 04/27/2022   Adjustment disorder with mixed anxiety and depressed mood 03/16/2022   Supervision of other normal pregnancy, antepartum 09/27/2021   Overweight BMI=27.4 08/12/2020   Anxiety dx'd 2020 08/12/2020   Hx of self mutilation--cutter age 66-15 07/12/2019    Plan  1. Admitted to L&D :   2. EFM:-- Category 1 3.  Epidural if desired.   4. Admission labs completed 5. Dr.Evans notified of admission  6. AROM-clear fluid  7. Anticipate NSVD   Doreene Burke, CNM  04/27/2022 1:49 PM

## 2022-04-27 NOTE — OB Triage Note (Signed)
Presents from [facility] visit this morning, where she had a routine PNV . Pt. Complained of contractions and a SVE was done. Per pt   the provider stated that she was 4cm dilated and recommended her come to LD. Patients denies LOF but states some bloody discharge noted since SVE. Denies any health issues. EFMs applied.

## 2022-04-27 NOTE — Anesthesia Procedure Notes (Signed)
Epidural Patient location during procedure: OB Start time: 04/27/2022 1:12 PM End time: 04/27/2022 1:23 PM  Staffing Anesthesiologist: Piscitello, Cleda Mccreedy, MD Resident/CRNA: Jeanine Luz, CRNA Performed: resident/CRNA   Preanesthetic Checklist Completed: patient identified, IV checked, risks and benefits discussed, surgical consent, monitors and equipment checked and pre-op evaluation  Epidural Patient position: sitting Prep: ChloraPrep Patient monitoring: heart rate, continuous pulse ox and blood pressure Approach: midline Location: L3-L4 Injection technique: LOR saline  Needle:  Needle type: Tuohy  Needle gauge: 17 G Needle length: 9 cm and 9 Needle insertion depth: 6 cm Catheter type: closed end flexible Catheter size: 19 Gauge Catheter at skin depth: 11 cm Test dose: negative and 1.5% lidocaine with Epi 1:200 K  Assessment Events: blood not aspirated, injection not painful, no injection resistance, no paresthesia and negative IV test  Additional Notes 1 attempt Pt. Evaluated and documentation done after procedure finished. Patient identified. Risks/Benefits/Options discussed with patient including but not limited to bleeding, infection, nerve damage, paralysis, failed block, incomplete pain control, headache, blood pressure changes, nausea, vomiting, reactions to medication both or allergic, itching and postpartum back pain. Confirmed with bedside nurse the patient's most recent platelet count. Confirmed with patient that they are not currently taking any anticoagulation, have any bleeding history or any family history of bleeding disorders. Patient expressed understanding and wished to proceed. All questions were answered. Sterile technique was used throughout the entire procedure. Please see nursing notes for vital signs. Test dose was given through epidural catheter and negative prior to continuing to dose epidural or start infusion. Warning signs of high block given to  the patient including shortness of breath, tingling/numbness in hands, complete motor block, or any concerning symptoms with instructions to call for help. Patient was given instructions on fall risk and not to get out of bed. All questions and concerns addressed with instructions to call with any issues or inadequate analgesia.    Patient tolerated the insertion well without immediate complications.Reason for block:procedure for pain

## 2022-04-28 DIAGNOSIS — Z3A39 39 weeks gestation of pregnancy: Secondary | ICD-10-CM

## 2022-04-28 LAB — CBC
HCT: 29.4 % — ABNORMAL LOW (ref 36.0–46.0)
Hemoglobin: 10.1 g/dL — ABNORMAL LOW (ref 12.0–15.0)
MCH: 29.8 pg (ref 26.0–34.0)
MCHC: 34.4 g/dL (ref 30.0–36.0)
MCV: 86.7 fL (ref 80.0–100.0)
Platelets: 187 10*3/uL (ref 150–400)
RBC: 3.39 MIL/uL — ABNORMAL LOW (ref 3.87–5.11)
RDW: 13.7 % (ref 11.5–15.5)
WBC: 15.7 10*3/uL — ABNORMAL HIGH (ref 4.0–10.5)
nRBC: 0 % (ref 0.0–0.2)

## 2022-04-28 LAB — RUBELLA SCREEN: Rubella: <0.9 index — ABNORMAL LOW (ref >0.99)

## 2022-04-28 LAB — VARICELLA ZOSTER ANTIBODY, IGG: Varicella IgG: 306 index (ref >165)

## 2022-04-28 LAB — RPR: RPR Ser Ql: NONREACTIVE

## 2022-04-28 MED ORDER — MEASLES, MUMPS & RUBELLA VAC IJ SOLR
0.5000 mL | Freq: Once | INTRAMUSCULAR | Status: AC
  Administered 2022-04-28: 0.5 mL via SUBCUTANEOUS
  Filled 2022-04-28: qty 0.5

## 2022-04-28 NOTE — Progress Notes (Signed)
Patient discharged. Discharge instructions given. Patient verbalizes understanding. Transported by staff. 

## 2022-04-28 NOTE — Discharge Summary (Signed)
OB Discharge Summary     Patient Name: Kellie Lowery DOB: 1996-01-22 MRN: [id number]  Date of admission: 04/27/2022 Delivering MD: Doreene Burke CNM Date of Delivery: 04/27/2022 Date of discharge: 04/28/2022  Admitting diagnosis: Labor and delivery, indication for care [O75.9] Intrauterine pregnancy: [redacted]w[redacted]d     Secondary diagnosis: None     Discharge diagnosis: Term Pregnancy Delivered                                                                                                Post partum procedures: none  Augmentation: AROM  Complications: None  Hospital course:  Onset of Labor With Vaginal Delivery      26 y.o. yo G2P1011 at [redacted]w[redacted]d was admitted in Active Labor on 04/27/2022. Labor course was uncomplicated. She progressed steadily. Received an epidural for pain management, AROM and pitocin for labor augmentation. Had good maternal efforts during second stage and birth baby "Bella". Had EBL of .  Membrane Rupture Time/Date: 1:40 PM ,04/27/2022   Delivery Method:Vaginal, Spontaneous  Episiotomy: None  Lacerations:  1st degree;Vaginal  that was repaired  Patient had a postpartum course without complications  She is ambulating, tolerating a regular diet, passing flatus, and urinating well. Has not yet had a BM. Reports breastfeeding is going well. Slight cramping pain that is relieved with analgesics. Light lochia, no clots. Good appetite. Patient is discharged home in stable condition on 04/28/22.  Newborn Data: Birth date:04/27/2022  Birth time:5:09 PM  Gender:Female  Living status:Living  Apgars:8 ,9  Weight:3360 g   Physical exam  Vitals:   04/27/22 2205 04/28/22 0040 04/28/22 0445 04/28/22 0900  BP: 100/64 106/62 111/80 107/76  Pulse: 93 78 61 72  Resp: 18 18 18 17   Temp: (!) 97.5 F (36.4 C) 97.6 F (36.4 C) 97.8 F (36.6 C) 97.6 F (36.4 C)  TempSrc: Oral Oral  Oral  SpO2: 100% 96% 100% 99%  Weight:      Height:       General:  alert, cooperative, and no distress Lochia: appropriate Uterine Fundus: firm Incision: Healing well with no significant drainage, No significant erythema DVT Evaluation: No evidence of DVT seen on physical exam.  Labs: Lab Results  Component Value Date   WBC 15.7 (H) 04/28/2022   HGB 10.1 (L) 04/28/2022   HCT 29.4 (L) 04/28/2022   MCV 86.7 04/28/2022   PLT 187 04/28/2022    Discharge instruction: per After Visit Summary.  Medications:  Allergies as of 04/28/2022       Reactions   Cinnamon Shortness Of Breath, Other (See Comments)   Sensations of choking        Medication List     TAKE these medications    calcium carbonate 500 MG chewable tablet Commonly known as: TUMS - dosed in mg elemental calcium Chew 1 tablet by mouth daily. "Sometimes"   cyclobenzaprine 5 MG tablet Commonly known as: FLEXERIL Take 1 tablet (5 mg total) by mouth 3 (three) times daily as needed for muscle spasms.   prenatal multivitamin Tabs tablet Take 1 tablet by mouth daily at 12  noon.        Diet: routine diet  Activity: Advance as tolerated. Pelvic rest for 6 weeks.   Outpatient follow up:  Follow-up Information     [facility].   Specialty: Obstetrics and Gynecology Why: six weeks postpartum Contact information: [address] [place] [address] [phone]                  Postpartum contraception: Nexplanon Rhogam Given postpartum: not indicated Rubella vaccine given postpartum: to be offered before discharge Varicella vaccine given postpartum: not indicated, is immune TDaP given antepartum or postpartum: received 02/21/22  Newborn Data: Live born female  Birth Weight: 7 lb 6.5 oz (3360 g) APGAR: 8, 9  Newborn Delivery   Birth date/time: 04/27/2022 17:09:00 Delivery type: Vaginal, Spontaneous       Baby Feeding: Breast  Disposition:home with mother Standard postpartum teaching completed  Raeford Razor, CNM,  FNP 04/28/2022 3:18 PM

## 2022-04-28 NOTE — Anesthesia Postprocedure Evaluation (Signed)
Anesthesia Post Note  Patient: Kellie Lowery  Procedure(s) Performed: AN AD HOC LABOR EPIDURAL  Patient location during evaluation: Mother Baby Anesthesia Type: Epidural Level of consciousness: awake and alert Pain management: pain level controlled Vital Signs Assessment: post-procedure vital signs reviewed and stable Respiratory status: spontaneous breathing, nonlabored ventilation and respiratory function stable Cardiovascular status: stable Postop Assessment: no headache, no backache and epidural receding Anesthetic complications: no   No notable events documented.   Last Vitals:  Vitals:   04/28/22 0445 04/28/22 0900  BP: 111/80 107/76  Pulse: 61 72  Resp: 18 17  Temp: 36.6 C 36.4 C  SpO2: 100% 99%    Last Pain:  Vitals:   04/28/22 0900  TempSrc: Oral  PainSc: 5                  Craig Wisnewski

## 2022-04-28 NOTE — Lactation Note (Signed)
This note was copied from a baby's chart. Lactation Consultation Note  Patient Name: Kellie Lowery [id number] Date: 04/28/2022 Reason for consult: Initial assessment;Primapara;Term;Breastfeeding assistance;Mother's request Age:24 hours  Maternal Data Has patient been taught Hand Expression?: Yes Does the patient have breastfeeding experience prior to this delivery?: No  P1, SVD 17 hours ago. Mom desires breastfeeding and formula options as needed. Mom has hx of anxiety.  Feeding Mother's Current Feeding Choice: Breast Milk and Formula  Baby has gone to the breast multiple times since delivery. Overnight baby was irritable and not settling. Mom asked for formula and a pump to be set-up; night RN provided both. Mom did attempt pumping but no measurable output; formula given 2x so far.  LATCH Score Latch: Grasps breast easily, tongue down, lips flanged, rhythmical sucking.  Audible Swallowing: Spontaneous and intermittent  Type of Nipple: Everted at rest and after stimulation  Comfort (Breast/Nipple): Filling, red/small blisters or bruises, mild/mod discomfort (mom reporting discomfort with initial latch)  Hold (Positioning): Assistance needed to correctly position infant at breast and maintain latch.  LATCH Score: 8  Mom called for breastfeeding assistance. Attempting to latch baby on her own upon entry. Baby crying loudly. LC assisted with repositioning baby for football hold on L breast. Mom notes that baby has not latched to this side yet. Support pillow added, sandwich hold for breast tissue.  Initially baby would open mouth with tongue on roof of mouth and would suck on tongue. Finally got baby to open wide; initial discomfort with latch that improved as baby transitioned into a rhythmic sucking pattern. Loud audible swallows heard throughout feeding.  Lactation Tools Discussed/Used Tools: Pump Breast pump type: Double-Electric Breast Pump Pump Education: Other  (comment) (Set-up overnight) Reason for Pumping: mom's request Pumping frequency: 2x  Interventions Interventions: Breast feeding basics reviewed;Assisted with latch;Hand express;Breast compression;Adjust position;Support pillows;Position options;DEBP;Education  Reviewed position/alignment of baby, tips to get baby to open mouth widely and sustain the latch, reviewed feeding patterns and behaviors, and encouraged to offer breast first before formula. Reviewed pumping plan- encouraged to use if baby does not have a good feed at the breast. Explained that the breast need stimulation/milk removal 8-12x/day- reviewed milk supply and demand and normal course of lactation.  Discharge    Consult Status Consult Status: Follow-up Date: 04/28/22 Follow-up type: In-patient    Danford Bad 04/28/2022, 10:39 AM

## 2022-06-20 ENCOUNTER — Ambulatory Visit: Payer: Self-pay

## 2022-07-01 ENCOUNTER — Ambulatory Visit: Payer: Self-pay | Admitting: Advanced Practice Midwife

## 2022-07-01 VITALS — BP 93/60 | HR 81 | Temp 98.1°F | Resp 16 | Ht 63.5 in | Wt 171.8 lb

## 2022-07-01 DIAGNOSIS — Z30017 Encounter for initial prescription of implantable subdermal contraceptive: Secondary | ICD-10-CM

## 2022-07-01 DIAGNOSIS — F4323 Adjustment disorder with mixed anxiety and depressed mood: Secondary | ICD-10-CM

## 2022-07-01 LAB — HEMOGLOBIN, FINGERSTICK: Hemoglobin: 12.1 g/dL (ref 11.1–15.9)

## 2022-07-01 MED ORDER — ETONOGESTREL 68 MG ~~LOC~~ IMPL
68.0000 mg | DRUG_IMPLANT | Freq: Once | SUBCUTANEOUS | Status: AC
  Administered 2022-07-01: 68 mg via SUBCUTANEOUS

## 2022-07-01 NOTE — Progress Notes (Signed)
Hgb reviewed (12.1) - no treatment indicated.   Al Decant, RN
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
## 2023-04-18 ENCOUNTER — Ambulatory Visit: Payer: Self-pay

## 2024-06-28 ENCOUNTER — Ambulatory Visit: Payer: Self-pay | Admitting: Family Medicine

## 2024-06-28 DIAGNOSIS — Z113 Encounter for screening for infections with a predominantly sexual mode of transmission: Secondary | ICD-10-CM

## 2024-06-28 DIAGNOSIS — Z114 Encounter for screening for human immunodeficiency virus [HIV]: Secondary | ICD-10-CM

## 2024-06-28 LAB — WET PREP FOR TRICH, YEAST, CLUE
Clue Cell Exam: NEGATIVE
Trichomonas Exam: NEGATIVE
Yeast Exam: NEGATIVE

## 2024-06-28 NOTE — Progress Notes (Addendum)
 STI clinic/screening visit [address] [place] [address] [phone]  Subjective:  Kellie Lowery is a 29 y.o. female being seen today for [facility]. The patient reports they do not have symptoms.    No LMP recorded. Patient has had an implant.  Patient has the following medical conditions:   Patient Active Problem List   Diagnosis Date Noted   Adjustment disorder with mixed anxiety and depressed mood 03/16/2022   Overweight BMI=27.4 08/12/2020   Anxiety dx'd 2020 08/12/2020   Hx of self mutilation--cutter age 38-15 07/12/2019    Chief Complaint  Patient presents with   SEXUALLY TRANSMITTED DISEASE    Does the patient using douching products? No  Last HIV test per patient/review of record was No results found for: HMHIVSCREEN No results found for: HIV Patient reports last pap was No results found for: DIAGPAP  Lab Results  Component Value Date   SPECADGYN Comment 08/12/2020    Screening for MPX risk: Does the patient have an unexplained rash? No Is the patient MSM? No Does the patient endorse multiple sex partners or anonymous sex partners? No Did the patient have close or sexual contact with a person diagnosed with MPX? No Has the patient traveled outside the [place]  where MPX is endemic? No Is there a high clinical suspicion for MPX-- evidenced by one of the following No  -Unlikely to be chickenpox  -Lymphadenopathy  -Rash that present in same phase of evolution on any given body part See flowsheet for further details and programmatic requirements.   Immunization history:  Immunization History  Administered Date(s) Administered   MMR 04/28/2022   Tdap 08/20/2016, 02/21/2022     The following portions of the patient's history were reviewed and updated as appropriate: allergies, current medications, past medical history, past social history, past surgical history and problem list.  Objective:  There were no vitals  filed for this visit.  Patient seen by RN only. Self collected swabs.    Assessment and Plan:  Kellie Lowery is a 29 y.o. female presenting to the [facility] for [facility]I screening in [facility]  1. Screening for venereal disease (Primary)  - Chlamydia/Gonorrhea  - HIV  - WET PREP FOR TRICH, YEAST, CLUE - Syphilis Serology   Patient accepted all screenings including vaginal CT/GC and bloodwork for HIV/RPR. Patient meets criteria for HepB screening? No. Ordered? no Patient meets criteria for HepC screening? No. Ordered? no  Treat positive results per standing order.  Discussed time line for State Lab results and that patient will be called with positive results and encouraged patient to call if she had not heard in 2 weeks.  Recommended repeat testing in 3 months with positive results. Recommended condom use with all sex  Patient is currently using *Nexplanon  to prevent pregnancy.    No state labs available due to holiday on Monday, labs ordered through [facility].  No follow-ups on file.  No future appointments.  Larraine JONELLE Novak, RN

## 2024-07-01 LAB — CHLAMYDIA/GC NAA, CONFIRMATION
Chlamydia trachomatis, NAA: NEGATIVE
Neisseria gonorrhoeae, NAA: NEGATIVE

## 2024-07-01 LAB — SYPHILIS: RPR W/REFLEX TO RPR TITER AND TREPONEMAL ANTIBODIES, TRADITIONAL SCREENING AND DIAGNOSIS ALGORITHM: RPR Ser Ql: NONREACTIVE

## 2024-07-01 LAB — HIV ANTIBODY (ROUTINE TESTING W REFLEX): HIV Screen 4th Generation wRfx: NONREACTIVE

## 2024-07-02 NOTE — Progress Notes (Signed)
Attestation of [facility]: Evaluation and management procedures were performed by the Registered Nurse under [facility] standing orders.  RN independently saw the patient and provided screenings for them without medical exam. Patient self collected specimens. I have reviewed the RN's note and chart, and I agree with screening ordered.   Ethyn Schetter, FNP  

## 2024-07-04 NOTE — Progress Notes (Signed)
 Attempt to call pt consrning lab results

## 2024-07-05 ENCOUNTER — Telehealth: Payer: Self-pay

## 2024-07-05 NOTE — Telephone Encounter (Signed)
 Pt has been called back regarding lab results and pt concerns has been addressed.

## 2024-07-18 NOTE — Telephone Encounter (Signed)
Pt has been called regarding lab results

## 2024-07-18 NOTE — Telephone Encounter (Signed)
 complete
